# Patient Record
Sex: Male | Born: 1955 | Race: Black or African American | Hispanic: No | Smoking: Never smoker
Health system: Southern US, Community
[De-identification: ages and names within clinical notes are randomized; demographics above are authoritative.]

## PROBLEM LIST (undated history)

## (undated) DIAGNOSIS — I10 Essential (primary) hypertension: Secondary | ICD-10-CM

## (undated) DIAGNOSIS — M1990 Unspecified osteoarthritis, unspecified site: Secondary | ICD-10-CM

## (undated) DIAGNOSIS — K219 Gastro-esophageal reflux disease without esophagitis: Secondary | ICD-10-CM

## (undated) DIAGNOSIS — M109 Gout, unspecified: Secondary | ICD-10-CM

## (undated) HISTORY — PX: ANKLE FRACTURE SURGERY: SHX122

## (undated) HISTORY — PX: FOOT SURGERY: SHX648

---

## 1998-02-23 ENCOUNTER — Encounter: Payer: Self-pay | Admitting: Emergency Medicine

## 1998-02-23 ENCOUNTER — Inpatient Hospital Stay (HOSPITAL_COMMUNITY): Admission: EM | Admit: 1998-02-23 | Discharge: 1998-02-24 | Payer: Self-pay | Admitting: Emergency Medicine

## 1998-02-23 ENCOUNTER — Encounter: Payer: Self-pay | Admitting: Orthopedic Surgery

## 2004-08-05 ENCOUNTER — Emergency Department: Payer: Self-pay | Admitting: Emergency Medicine
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
## 2015-01-02 ENCOUNTER — Ambulatory Visit (INDEPENDENT_AMBULATORY_CARE_PROVIDER_SITE_OTHER): Payer: BLUE CROSS/BLUE SHIELD

## 2015-01-02 ENCOUNTER — Encounter: Payer: Self-pay | Admitting: Podiatry

## 2015-01-02 ENCOUNTER — Ambulatory Visit (INDEPENDENT_AMBULATORY_CARE_PROVIDER_SITE_OTHER): Payer: BLUE CROSS/BLUE SHIELD | Admitting: Podiatry

## 2015-01-02 VITALS — BP 148/98 | HR 72 | Resp 12

## 2015-01-02 DIAGNOSIS — M14679 Charcot's joint, unspecified ankle and foot: Secondary | ICD-10-CM

## 2015-01-02 DIAGNOSIS — M79673 Pain in unspecified foot: Secondary | ICD-10-CM | POA: Diagnosis not present

## 2015-01-02 DIAGNOSIS — R52 Pain, unspecified: Secondary | ICD-10-CM

## 2015-01-02 NOTE — Progress Notes (Signed)
   Subjective:    Patient ID: Bill Howell, male    DOB: 08/24/1955, 59 y.o.   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 He states that he has had foot issues over the past several years and has been seen by the [facility] and has had orthotics made as well as inserts. Currently he states that these orthotics are to harden there not helping him he has started to develop thickening of the medial aspect of the tarsometatarsal joints tickling the first metatarsal medial cuneiform joint consistent with Charcot deformity. He denies trauma. He states that he is on his feet all day and the pain is beginning to get to him.  Review of Systems  All other systems reviewed and are negative.      Objective:   Physical Exam: 6 foot 4 inches tall, 59 year old white male in no apparent distress vital signs stable alert and oriented 3. Pulses are strongly palpable. Neurologic sensorium is diminished per Semmes-Weinstein monofilament to the level of the ankles. Deep tendon reflexes are intact muscle strength is normal bilateral. No calf pain. Orthopedic evaluation demonstrates all joints is like a full range of motion without crepitus pes planus is noted bilateral. Very large thickened first metatarsocuneiform joint left greater than right consistent with Charcot deformity radiographs confirm pes planus with what appears to be Charcot deformity of the first metatarsal medial cuneiforms joint left greater than right. Cutaneous evaluation demonstrates supple well-hydrated. He is no skin breakdown.        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  Plan: He was scanned today for flexible set of orthotics we may need to consider medication next visit. Follow up with him when his orthotics from him.

## 2015-02-01 ENCOUNTER — Ambulatory Visit (INDEPENDENT_AMBULATORY_CARE_PROVIDER_SITE_OTHER): Payer: BLUE CROSS/BLUE SHIELD | Admitting: *Deleted

## 2015-02-01 DIAGNOSIS — M14679 Charcot's joint, unspecified ankle and foot: Secondary | ICD-10-CM

## 2015-03-15 ENCOUNTER — Encounter: Payer: Self-pay | Admitting: Podiatry

## 2015-03-15 ENCOUNTER — Ambulatory Visit (INDEPENDENT_AMBULATORY_CARE_PROVIDER_SITE_OTHER): Payer: BLUE CROSS/BLUE SHIELD | Admitting: Podiatry

## 2015-03-15 VITALS — BP 168/94 | HR 75 | Resp 18

## 2015-03-15 DIAGNOSIS — M14679 Charcot's joint, unspecified ankle and foot: Secondary | ICD-10-CM

## 2015-03-15 NOTE — Progress Notes (Signed)
He presents today for follow-up of his orthotics he has a history of Charcot joint. He states that he is doing just perfect. Lungs his orthotics.  Objective: Vital signs are stable he is alert and oriented 3 pulses are palpable. I evaluated his feet demonstrating no reactive hyperkeratoses no erythema edema cellulitis drainage or odor and no open wounds. I also evaluated the orthotics which appear to be doing very well.  Assessment: Charcot deformity.  Plan: Continue to use orthotics and notify us with questions or concerns. He was requesting a permanent handicap sticker. I explained to him that we do not provide permanent placards I suggested that he follow-up with his primary care provider.

## 2016-07-15 ENCOUNTER — Ambulatory Visit (INDEPENDENT_AMBULATORY_CARE_PROVIDER_SITE_OTHER): Payer: 59 | Admitting: Podiatry

## 2016-07-15 ENCOUNTER — Encounter: Payer: Self-pay | Admitting: Podiatry

## 2016-07-15 DIAGNOSIS — M14679 Charcot's joint, unspecified ankle and foot: Secondary | ICD-10-CM | POA: Diagnosis not present

## 2016-07-15 DIAGNOSIS — M79676 Pain in unspecified toe(s): Secondary | ICD-10-CM | POA: Diagnosis not present

## 2016-07-15 DIAGNOSIS — B351 Tinea unguium: Secondary | ICD-10-CM

## 2016-07-15 DIAGNOSIS — Q828 Other specified congenital malformations of skin: Secondary | ICD-10-CM

## 2016-07-15 NOTE — Progress Notes (Signed)
He presents today for follow-up of his painful calluses and elongated toenails. 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 He states that he has redeveloped a reactive hyperkeratotic sub-fourth met head right foot. For me to look at his toenails. I left my orthotics and might consider getting another pair made one that will accommodate for this right foot.  Objective: Vital signs are stable he is alert and oriented 3 Charcot deformity appears to be the same however he has a reactive hyperkeratotic lesion or poor keratotic lesion sub-fourth met head of the right foot without any signs of infection. His nails are long thick yellow dystrophic onychomycotic.  Assessment: Charcot deformity with neuropathy. Plantarflexed metatarsals with poor keratomas.  Plan: I debrided his nails and calluses today. He will be seen by Liliane Channel for orthotics will probably necessitate a new mole and offloading of his reactive hyperkeratosis. He will bring his old orthotics with him.

## 2016-08-05 ENCOUNTER — Ambulatory Visit: Payer: 59 | Admitting: Podiatry

## 2016-08-14 ENCOUNTER — Ambulatory Visit: Payer: 59

## 2017-02-18 DIAGNOSIS — R739 Hyperglycemia, unspecified: Secondary | ICD-10-CM | POA: Insufficient documentation

## 2017-02-18 DIAGNOSIS — K219 Gastro-esophageal reflux disease without esophagitis: Secondary | ICD-10-CM | POA: Insufficient documentation

## 2017-02-18 DIAGNOSIS — I10 Essential (primary) hypertension: Secondary | ICD-10-CM | POA: Insufficient documentation

## 2017-02-18 DIAGNOSIS — M1A00X0 Idiopathic chronic gout, unspecified site, without tophus (tophi): Secondary | ICD-10-CM | POA: Insufficient documentation

## 2019-03-12 ENCOUNTER — Ambulatory Visit: Payer: Medicare Other | Attending: Internal Medicine

## 2019-03-12 DIAGNOSIS — Z23 Encounter for immunization: Secondary | ICD-10-CM | POA: Insufficient documentation

## 2019-03-12 NOTE — Progress Notes (Signed)
   Covid-19 Vaccination Clinic  Name:  Bill Howell    MRN: [id number] DOB: 05-Sep-1955  03/12/2019  Bill Howell was observed post Covid-19 immunization for 15 minutes without incidence. He was provided with Vaccine Information Sheet and instruction to access the V-Safe system.   Bill Howell was instructed to call 911 with any severe reactions post vaccine: Marland Kitchen Difficulty breathing  . Swelling of your face and throat  . A fast heartbeat  . A bad rash all over your body  . Dizziness and weakness    Immunizations Administered    Name Date Dose VIS Date Route   Pfizer COVID-19 Vaccine 03/12/2019  2:06 PM 0.3 mL 01/08/2019 Intramuscular   Manufacturer: [place]   Lot: [id number]   [place]: [id number]

## 2019-04-04 ENCOUNTER — Ambulatory Visit: Payer: Medicare Other | Attending: Internal Medicine

## 2019-04-04 DIAGNOSIS — Z23 Encounter for immunization: Secondary | ICD-10-CM | POA: Insufficient documentation

## 2019-04-04 NOTE — Progress Notes (Signed)
   Covid-19 Vaccination Clinic  Name:  Bill Howell    MRN: [id number] DOB: 1955/04/01  04/04/2019  [name] was observed post Covid-19 immunization for 15 minutes without incident. He was provided with Vaccine Information Sheet and instruction to access the V-Safe system.   [name] was instructed to call 911 with any severe reactions post vaccine: Marland Kitchen Difficulty breathing  . Swelling of face and throat  . A fast heartbeat  . A bad rash all over body  . Dizziness and weakness   Immunizations Administered    Name Date Dose VIS Date Route   Pfizer COVID-19 Vaccine 04/04/2019 10:17 AM 0.3 mL 01/08/2019 Intramuscular   Manufacturer: [place]   Lot: [id number]   [place]: [id number]

## 2021-09-05 ENCOUNTER — Ambulatory Visit (INDEPENDENT_AMBULATORY_CARE_PROVIDER_SITE_OTHER): Payer: Medicare Other | Admitting: Podiatry

## 2021-09-05 ENCOUNTER — Ambulatory Visit (INDEPENDENT_AMBULATORY_CARE_PROVIDER_SITE_OTHER): Payer: Medicare Other

## 2021-09-05 ENCOUNTER — Encounter: Payer: Self-pay | Admitting: Podiatry

## 2021-09-05 DIAGNOSIS — Z135 Encounter for screening for eye and ear disorders: Secondary | ICD-10-CM | POA: Insufficient documentation

## 2021-09-05 DIAGNOSIS — L97511 Non-pressure chronic ulcer of other part of right foot limited to breakdown of skin: Secondary | ICD-10-CM | POA: Diagnosis not present

## 2021-09-05 DIAGNOSIS — D3500 Benign neoplasm of unspecified adrenal gland: Secondary | ICD-10-CM | POA: Insufficient documentation

## 2021-09-05 DIAGNOSIS — H6121 Impacted cerumen, right ear: Secondary | ICD-10-CM | POA: Insufficient documentation

## 2021-09-05 DIAGNOSIS — M109 Gout, unspecified: Secondary | ICD-10-CM | POA: Insufficient documentation

## 2021-09-05 DIAGNOSIS — G4730 Sleep apnea, unspecified: Secondary | ICD-10-CM | POA: Insufficient documentation

## 2021-09-05 DIAGNOSIS — M86171 Other acute osteomyelitis, right ankle and foot: Secondary | ICD-10-CM | POA: Diagnosis not present

## 2021-09-05 DIAGNOSIS — M79671 Pain in right foot: Secondary | ICD-10-CM | POA: Insufficient documentation

## 2021-09-05 DIAGNOSIS — E1140 Type 2 diabetes mellitus with diabetic neuropathy, unspecified: Secondary | ICD-10-CM | POA: Insufficient documentation

## 2021-09-05 DIAGNOSIS — M2140 Flat foot [pes planus] (acquired), unspecified foot: Secondary | ICD-10-CM | POA: Insufficient documentation

## 2021-09-05 DIAGNOSIS — M778 Other enthesopathies, not elsewhere classified: Secondary | ICD-10-CM | POA: Diagnosis not present

## 2021-09-05 DIAGNOSIS — Z1211 Encounter for screening for malignant neoplasm of colon: Secondary | ICD-10-CM | POA: Insufficient documentation

## 2021-09-05 DIAGNOSIS — R7989 Other specified abnormal findings of blood chemistry: Secondary | ICD-10-CM | POA: Insufficient documentation

## 2021-09-05 DIAGNOSIS — E119 Type 2 diabetes mellitus without complications: Secondary | ICD-10-CM | POA: Insufficient documentation

## 2021-09-05 DIAGNOSIS — R7309 Other abnormal glucose: Secondary | ICD-10-CM | POA: Insufficient documentation

## 2021-09-05 MED ORDER — AMOXICILLIN-POT CLAVULANATE 875-125 MG PO TABS: 1.0000 | ORAL_TABLET | Freq: Two times a day (BID) | ORAL | 0 refills | Status: DC

## 2021-09-05 MED ORDER — OXYCODONE-ACETAMINOPHEN 10-325 MG PO TABS: 1.0000 | ORAL_TABLET | Freq: Three times a day (TID) | ORAL | 0 refills | Status: DC | PRN

## 2021-09-05 MED ORDER — MUPIROCIN 2 % EX OINT: 1.0000 | TOPICAL_OINTMENT | Freq: Two times a day (BID) | CUTANEOUS | 0 refills | Status: AC

## 2021-09-05 NOTE — Progress Notes (Signed)
He presents today for a chief complaint of plantar forefoot pain to the his right foot.  States that there is a small wound there for the past several weeks the foot has been red on the top [place] is really cannot walk well states that nighttime seems to be the worst but he denies fever chills nausea vomiting muscle aches and pains other than the foot.  Objective: Vital signs are stable he is alert and oriented x 3 superficial wound to the plantar aspect of the third metatarsal head of the right foot does not demonstrate any purulence or malodor wound measures only about 3 mm in diameter.  The majority of the pain is on the dorsal surface at the level of the metatarsal phalangeal joint area.  There is mild erythema some mild edema pain on range of motion of the toe.  Radiographs taken today demonstrate an osseously mature foot the area in question is the third metatarsal which does demonstrate some breakdown of the head of the metatarsal either due to complete dislocation of the toe and rubbing or septic joint or osteomyelitis.  Assessment: Ulceration plantar aspect forefoot right that part appears to be clean.  Cannot rule out a septic joint or osteomyelitis.  Plan: With no constitutional signs of infection at this point we will request the MRI of the forefoot concerning for osteomyelitis or septic joint.  Possible tear of the plantar ligament.  Will go ahead and start him on antibiotic ointment Bactroban dressing daily try to stay off the foot is much as possible.  Also requesting Augmentin 875 1 p.o. twice daily and I prescribed him oxycodone with acetaminophen.  Follow-up with him as soon as the MRI comes back.

## 2021-09-17 ENCOUNTER — Telehealth: Payer: Self-pay

## 2021-09-17 ENCOUNTER — Other Ambulatory Visit: Payer: Self-pay | Admitting: Podiatry

## 2021-09-17 MED ORDER — OXYCODONE-ACETAMINOPHEN 10-325 MG PO TABS: 1.0000 | ORAL_TABLET | Freq: Three times a day (TID) | ORAL | 0 refills | Status: AC | PRN

## 2021-09-17 NOTE — Telephone Encounter (Signed)
Done

## 2021-09-19 ENCOUNTER — Ambulatory Visit
Admission: RE | Admit: 2021-09-19 | Discharge: 2021-09-19 | Disposition: A | Discharge status: Home / Self Care | Payer: Medicare Other | Source: Ambulatory Visit | Attending: Podiatry | Admitting: Podiatry

## 2021-09-19 DIAGNOSIS — L97511 Non-pressure chronic ulcer of other part of right foot limited to breakdown of skin: Secondary | ICD-10-CM

## 2021-09-19 DIAGNOSIS — M86171 Other acute osteomyelitis, right ankle and foot: Secondary | ICD-10-CM

## 2021-09-25 ENCOUNTER — Telehealth: Payer: Self-pay | Admitting: *Deleted

## 2021-09-25 NOTE — Telephone Encounter (Signed)
Patient has been set up with Dr. Posey Pronto for consult.

## 2021-09-25 NOTE — Telephone Encounter (Signed)
"  I need to know what Dr. Milinda Pointer would like to do with me.  Does he need to schedule another appointment with me.  Could someone give me a call and let me know what's going on?"

## 2021-09-27 ENCOUNTER — Other Ambulatory Visit: Payer: Self-pay | Admitting: Podiatry

## 2021-09-27 ENCOUNTER — Telehealth: Payer: Self-pay | Admitting: *Deleted

## 2021-09-27 MED ORDER — OXYCODONE-ACETAMINOPHEN 10-325 MG PO TABS: 1.0000 | ORAL_TABLET | Freq: Three times a day (TID) | ORAL | 0 refills | Status: AC | PRN

## 2021-09-27 NOTE — Telephone Encounter (Signed)
Patient is requesting a refill of oxycodone-ace,10-325 mg,only 2 remaining,please advise.

## 2021-09-27 NOTE — Progress Notes (Unsigned)
per

## 2021-10-02 ENCOUNTER — Telehealth: Payer: Self-pay | Admitting: *Deleted

## 2021-10-02 ENCOUNTER — Ambulatory Visit (INDEPENDENT_AMBULATORY_CARE_PROVIDER_SITE_OTHER): Payer: Medicare Other | Admitting: Podiatry

## 2021-10-02 DIAGNOSIS — Z01818 Encounter for other preprocedural examination: Secondary | ICD-10-CM | POA: Diagnosis not present

## 2021-10-02 DIAGNOSIS — L97511 Non-pressure chronic ulcer of other part of right foot limited to breakdown of skin: Secondary | ICD-10-CM | POA: Diagnosis not present

## 2021-10-02 DIAGNOSIS — M86171 Other acute osteomyelitis, right ankle and foot: Secondary | ICD-10-CM | POA: Diagnosis not present

## 2021-10-02 MED ORDER — DOXYCYCLINE HYCLATE 100 MG PO TABS: 100.0000 mg | ORAL_TABLET | Freq: Two times a day (BID) | ORAL | 0 refills | Status: AC

## 2021-10-02 MED ORDER — OXYCODONE-ACETAMINOPHEN 5-325 MG PO TABS: 1.0000 | ORAL_TABLET | ORAL | 0 refills | Status: DC | PRN

## 2021-10-02 NOTE — Telephone Encounter (Signed)
Patient called for status of medications being sent to pharmacy,explained that they were sent to [facility] on file.

## 2021-10-09 NOTE — Progress Notes (Signed)
Subjective:  Patient ID: Bill Howell, male    DOB: 01/29/1955,  MRN: [id number]  Chief Complaint  Patient presents with   Foot Pain    Rt foot     66 y.o. male presents with the above complaint.  Patient presents with follow-up from right severely plantarflexed third metatarsal.  Patient's has a wound that is been developing that probes down to deep bone.  He was being started conservative care by Dr. Milinda Pointer.  He had an MRI done and is referred to me for surgical consultation.  He denies any other acute complaints he denies any nausea fever chills vomiting.  He has not been taking any antibiotics.  He here to go over the MRI.  Review of Systems: Negative except as noted in the HPI. Denies N/V/F/Ch.  No past medical history on file.  Current Outpatient Medications:    doxycycline (VIBRA-TABS) 100 MG tablet, Take 1 tablet (100 mg total) by mouth 2 (two) times daily., Disp: 60 tablet, Rfl: 0   oxyCODONE-acetaminophen (PERCOCET) 5-325 MG tablet, Take 1 tablet by mouth every 4 (four) hours as needed for severe pain., Disp: 30 tablet, Rfl: 0   allopurinol (ZYLOPRIM) 100 MG tablet, , Disp: , Rfl:    amoxicillin-clavulanate (AUGMENTIN) 875-125 MG tablet, Take 1 tablet by mouth 2 (two) times daily., Disp: 20 tablet, Rfl: 0   capsaicin (ZOSTRIX) 0.025 % cream, , Disp: , Rfl:    carvedilol (COREG) 12.5 MG tablet, , Disp: , Rfl:    losartan (COZAAR) 100 MG tablet, Take 100 mg by mouth daily., Disp: , Rfl:    metFORMIN (GLUCOPHAGE) 500 MG tablet, Take by mouth 2 (two) times daily with a meal., Disp: , Rfl:    mupirocin ointment (BACTROBAN) 2 %, Apply 1 Application topically 2 (two) times daily., Disp: 22 g, Rfl: 0   omeprazole (PRILOSEC) 20 MG capsule, , Disp: , Rfl:    valsartan (DIOVAN) 160 MG tablet, TAKE 1 TABLET BY MOUTH EVERY DAY IN THE MORNING, Disp: , Rfl: 0  Social History   Tobacco Use  Smoking Status Never  Smokeless Tobacco Never    Allergies  Allergen Reactions   Lisinopril      Other reaction(s): Xerostomia   Gabapentin Anxiety   Objective:  There were no vitals filed for this visit. There is no height or weight on file to calculate BMI. Constitutional Well developed. Well nourished.  Vascular Dorsalis pedis pulses palpable bilaterally. Posterior tibial pulses palpable bilaterally. Capillary refill normal to all digits.  No cyanosis or clubbing noted. Pedal hair growth normal.  Neurologic Normal speech. Oriented to person, place, and time. Epicritic sensation to light touch grossly present bilaterally.  Dermatologic Open wound noted to submetatarsal 3 probing down to deep tissue no purulent drainage noted.  No redness noted.  No signs of infection noted.  Orthopedic: Right severely plantarflexed third metatarsal noted.   Radiographs: IMPRESSION: 1. Although no gross radiographic change is identified, the distal 3rd metatarsal and base of the 3rd proximal phalanx are grossly abnormal on MRI. There is dorsal dislocation with bone destruction, T2 marrow hyperintensity and apparent plantar soft tissue ulceration, highly suspicious for osteomyelitis. 2. Associated probable septic arthritis and small surrounding soft tissue abscesses. 3. 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 Ulcer of right foot, limited to breakdown of skin ([place])   2. Acute osteomyelitis of right ankle or foot ([place])   3. Encounter for preoperative examination for general surgical procedure    Plan:  Patient  was evaluated and treated and all questions answered.  Right third metatarsal head osteomyelitis and base of proximal phalanx osteomyelitis -All questions and concerns were discussed with the patient in extensive detail -I am I was reviewed with the patient in extensive detail, given that there is suspicion for osteomyelitis I believe patient will benefit from metatarsectomy of the third metatarsal head and base of the proximal phalanx.  Ultimately I  discussed with him that there may be transfer lesion in the future that we may need to address he states understanding.  He has a wound that correlates clinically down to the bone therefore I am suspicious for osteomyelitis as well clinically.  I discussed with him that he will benefit from internal amputation.  He states understanding would like to proceed with surgery. -I discussed my preoperative intraoperative postop plan in extensive detail. -Informed surgical risk consent was reviewed and read aloud to the patient.  I reviewed the films.  I have discussed my findings with the patient in great detail.  I have discussed all risks including but not limited to infection, stiffness, scarring, limp, disability, deformity, damage to blood vessels and nerves, numbness, poor healing, need for braces, arthritis, chronic pain, amputation, death.  All benefits and realistic expectations discussed in great detail.  I have made no promises as to the outcome.  I have provided realistic expectations.  I have offered the patient a 2nd opinion, which they have declined and assured me they preferred to proceed despite the risks   No follow-ups on file.

## 2021-10-15 ENCOUNTER — Telehealth: Payer: Self-pay | Admitting: *Deleted

## 2021-10-15 MED ORDER — OXYCODONE-ACETAMINOPHEN 5-325 MG PO TABS: 1.0000 | ORAL_TABLET | ORAL | 0 refills | Status: DC | PRN

## 2021-10-15 NOTE — Telephone Encounter (Signed)
Patient is requesting a pain medication refill. (Oxycodone-ace,5-325 mg) sent to [facility].

## 2021-10-24 ENCOUNTER — Other Ambulatory Visit: Payer: Self-pay | Admitting: Podiatry

## 2021-10-25 NOTE — Telephone Encounter (Signed)
Patient is calling for another refill of the pain medicine, oxycodone-ace, 5/325 mg,only one pill remaining and is scheduled for upcoming surgery. Please advise.

## 2021-10-29 ENCOUNTER — Encounter: Payer: Self-pay | Admitting: Podiatry

## 2021-10-29 ENCOUNTER — Other Ambulatory Visit: Payer: Self-pay | Admitting: Podiatry

## 2021-10-29 DIAGNOSIS — M21541 Acquired clubfoot, right foot: Secondary | ICD-10-CM

## 2021-10-29 MED ORDER — IBUPROFEN 800 MG PO TABS: 800.0000 mg | ORAL_TABLET | Freq: Four times a day (QID) | ORAL | 1 refills | Status: DC | PRN

## 2021-10-29 MED ORDER — OXYCODONE-ACETAMINOPHEN 5-325 MG PO TABS: 1.0000 | ORAL_TABLET | ORAL | 0 refills | Status: DC | PRN

## 2021-10-30 ENCOUNTER — Other Ambulatory Visit: Payer: Medicare Other

## 2021-10-31 NOTE — Telephone Encounter (Signed)
Patient is calling for status of pain medicine that was supposed to be sent to pharmacy, script failed,did not go to pharmacy, please resend

## 2021-11-06 ENCOUNTER — Encounter: Payer: Self-pay | Admitting: Podiatry

## 2021-11-06 ENCOUNTER — Ambulatory Visit (INDEPENDENT_AMBULATORY_CARE_PROVIDER_SITE_OTHER): Payer: Medicare Other

## 2021-11-06 ENCOUNTER — Ambulatory Visit (INDEPENDENT_AMBULATORY_CARE_PROVIDER_SITE_OTHER): Payer: Medicare Other | Admitting: Podiatry

## 2021-11-06 DIAGNOSIS — Z9889 Other specified postprocedural states: Secondary | ICD-10-CM | POA: Diagnosis not present

## 2021-11-06 DIAGNOSIS — M216X1 Other acquired deformities of right foot: Secondary | ICD-10-CM

## 2021-11-06 MED ORDER — OXYCODONE-ACETAMINOPHEN 5-325 MG PO TABS: 1.0000 | ORAL_TABLET | ORAL | 0 refills | Status: DC | PRN

## 2021-11-06 NOTE — Progress Notes (Signed)
Subjective:  Patient ID: Bill Howell, male    DOB: [birth year],  MRN: [id number]  Chief Complaint  Patient presents with   Routine Post Op    "I don't need xrays because all he did was open my foot up and then stitched it back.  I haven't talked to anyone since the surgery about why."    DOS: 10/29/2021 Procedure: Right third metatarsal floating osteotomy  66 y.o. male returns for post-op check.  Patient states that he is doing okay.  Minimal pain.  He would like his pain medication.  He has been ambulating with his regular shoes I encouraged him to put surgical shoe back on.  He states understanding  Review of Systems: Negative except as noted in the HPI. Denies N/V/F/Ch.  No past medical history on file.  Current Outpatient Medications:    oxyCODONE-acetaminophen (PERCOCET) 5-325 MG tablet, Take 1 tablet by mouth every 4 (four) hours as needed for severe pain., Disp: 30 tablet, Rfl: 0   allopurinol (ZYLOPRIM) 100 MG tablet, , Disp: , Rfl:    amoxicillin-clavulanate (AUGMENTIN) 875-125 MG tablet, Take 1 tablet by mouth 2 (two) times daily., Disp: 20 tablet, Rfl: 0   capsaicin (ZOSTRIX) 0.025 % cream, , Disp: , Rfl:    carvedilol (COREG) 12.5 MG tablet, , Disp: , Rfl:    ibuprofen (ADVIL) 800 MG tablet, Take 1 tablet (800 mg total) by mouth every 6 (six) hours as needed., Disp: 60 tablet, Rfl: 1   losartan (COZAAR) 100 MG tablet, Take 100 mg by mouth daily., Disp: , Rfl:    metFORMIN (GLUCOPHAGE) 500 MG tablet, Take by mouth 2 (two) times daily with a meal., Disp: , Rfl:    mupirocin ointment (BACTROBAN) 2 %, Apply 1 Application topically 2 (two) times daily., Disp: 22 g, Rfl: 0   omeprazole (PRILOSEC) 20 MG capsule, , Disp: , Rfl:    oxyCODONE-acetaminophen (PERCOCET) 5-325 MG tablet, Take 1 tablet by mouth every 4 (four) hours as needed for severe pain. (Patient not taking: Reported on 11/06/2021), Disp: 30 tablet, Rfl: 0   oxyCODONE-acetaminophen (PERCOCET) 5-325 MG tablet, Take 1  tablet by mouth every 4 (four) hours as needed for severe pain., Disp: 30 tablet, Rfl: 0   valsartan (DIOVAN) 160 MG tablet, TAKE 1 TABLET BY MOUTH EVERY DAY IN THE MORNING, Disp: , Rfl: 0  Social History   Tobacco Use  Smoking Status Never  Smokeless Tobacco Never    Allergies  Allergen Reactions   Lisinopril     Other reaction(s): Xerostomia   Gabapentin Anxiety   Objective:  There were no vitals filed for this visit. There is no height or weight on file to calculate BMI. Constitutional Well developed. Well nourished.  Vascular Foot warm and well perfused. Capillary refill normal to all digits.   Neurologic Normal speech. Oriented to person, place, and time. Epicritic sensation to light touch grossly present bilaterally.  Dermatologic Skin healing well without signs of infection. Skin edges well coapted without signs of infection.  Orthopedic: Tenderness to palpation noted about the surgical site.   Radiographs: None.  Patient refused Assessment:   1. Status post surgery   2. Plantar flexed metatarsal, right    Plan:  Patient was evaluated and treated and all questions answered.  S/p foot surgery right -Progressing as expected post-operatively. -XR: See above -WB Status: While bearing as tolerated in surgical shoe -Sutures: Intact.  No clinical signs of Deis is noted no complication noted. -Medications: Percocet -Foot redressed.  No  follow-ups on file.

## 2021-11-20 ENCOUNTER — Ambulatory Visit (INDEPENDENT_AMBULATORY_CARE_PROVIDER_SITE_OTHER): Payer: Medicare Other | Admitting: Podiatry

## 2021-11-20 DIAGNOSIS — Z9889 Other specified postprocedural states: Secondary | ICD-10-CM

## 2021-11-20 DIAGNOSIS — M216X1 Other acquired deformities of right foot: Secondary | ICD-10-CM

## 2021-11-20 NOTE — Progress Notes (Signed)
  Subjective:  Patient ID: Bill Howell, male    DOB: 1955/10/18,  MRN: [id number]  Chief Complaint  Patient presents with   Routine Post Op    DOS: 10/29/2021 Procedure: Right third metatarsal floating osteotomy  66 y.o. male returns for post-op check.  He states feeling a lot better occasional discomfort but overall much better he is transitioning regular shoes.  Review of Systems: Negative except as noted in the HPI. Denies N/V/F/Ch.  No past medical history on file.  Current Outpatient Medications:    allopurinol (ZYLOPRIM) 100 MG tablet, , Disp: , Rfl:    amoxicillin-clavulanate (AUGMENTIN) 875-125 MG tablet, Take 1 tablet by mouth 2 (two) times daily., Disp: 20 tablet, Rfl: 0   capsaicin (ZOSTRIX) 0.025 % cream, , Disp: , Rfl:    carvedilol (COREG) 12.5 MG tablet, , Disp: , Rfl:    ibuprofen (ADVIL) 800 MG tablet, Take 1 tablet (800 mg total) by mouth every 6 (six) hours as needed., Disp: 60 tablet, Rfl: 1   losartan (COZAAR) 100 MG tablet, Take 100 mg by mouth daily., Disp: , Rfl:    metFORMIN (GLUCOPHAGE) 500 MG tablet, Take by mouth 2 (two) times daily with a meal., Disp: , Rfl:    mupirocin ointment (BACTROBAN) 2 %, Apply 1 Application topically 2 (two) times daily., Disp: 22 g, Rfl: 0   omeprazole (PRILOSEC) 20 MG capsule, , Disp: , Rfl:    oxyCODONE-acetaminophen (PERCOCET) 5-325 MG tablet, Take 1 tablet by mouth every 4 (four) hours as needed for severe pain. (Patient not taking: Reported on 11/06/2021), Disp: 30 tablet, Rfl: 0   oxyCODONE-acetaminophen (PERCOCET) 5-325 MG tablet, Take 1 tablet by mouth every 4 (four) hours as needed for severe pain., Disp: 30 tablet, Rfl: 0   oxyCODONE-acetaminophen (PERCOCET) 5-325 MG tablet, Take 1 tablet by mouth every 4 (four) hours as needed for severe pain., Disp: 30 tablet, Rfl: 0   valsartan (DIOVAN) 160 MG tablet, TAKE 1 TABLET BY MOUTH EVERY DAY IN THE MORNING, Disp: , Rfl: 0  Social History   Tobacco Use  Smoking Status  Never  Smokeless Tobacco Never    Allergies  Allergen Reactions   Lisinopril     Other reaction(s): Xerostomia   Gabapentin Anxiety   Objective:  There were no vitals filed for this visit. There is no height or weight on file to calculate BMI. Constitutional Well developed. Well nourished.  Vascular Foot warm and well perfused. Capillary refill normal to all digits.   Neurologic Normal speech. Oriented to person, place, and time. Epicritic sensation to light touch grossly present bilaterally.  Dermatologic Skin completely reepithelialized.  No signs of dehiscence noted no complication noted.  Orthopedic: No further tenderness to palpation noted about the surgical site.   Radiographs: None.  Patient refused Assessment:   No diagnosis found.  Plan:  Patient was evaluated and treated and all questions answered.  S/p foot surgery right -Clinically healed and officially discharged from my care.  Reduction of pressure noted to the submetatarsal 3.  No other acute complaints. -I discussed with him prevention technique and if any foot and ankle issues arises in the future asked her to come back and see me.  He states understanding  No follow-ups on file.

## 2022-04-26 ENCOUNTER — Other Ambulatory Visit: Payer: Self-pay | Admitting: Internal Medicine

## 2022-04-26 DIAGNOSIS — R1011 Right upper quadrant pain: Secondary | ICD-10-CM

## 2022-05-13 ENCOUNTER — Ambulatory Visit
Admission: RE | Admit: 2022-05-13 | Discharge: 2022-05-13 | Disposition: A | Discharge status: Home / Self Care | Payer: No Typology Code available for payment source | Source: Ambulatory Visit | Attending: Internal Medicine | Admitting: Internal Medicine

## 2022-05-13 DIAGNOSIS — R1011 Right upper quadrant pain: Secondary | ICD-10-CM

## 2022-08-19 ENCOUNTER — Other Ambulatory Visit: Payer: Self-pay | Admitting: Internal Medicine

## 2022-08-19 DIAGNOSIS — R9389 Abnormal findings on diagnostic imaging of other specified body structures: Secondary | ICD-10-CM

## 2022-08-19 DIAGNOSIS — R0789 Other chest pain: Secondary | ICD-10-CM

## 2022-08-23 ENCOUNTER — Ambulatory Visit
Admission: RE | Admit: 2022-08-23 | Discharge: 2022-08-23 | Disposition: A | Discharge status: Home / Self Care | Payer: No Typology Code available for payment source | Source: Ambulatory Visit | Attending: Internal Medicine | Admitting: Internal Medicine

## 2022-08-23 DIAGNOSIS — R9389 Abnormal findings on diagnostic imaging of other specified body structures: Secondary | ICD-10-CM | POA: Diagnosis present

## 2022-08-23 DIAGNOSIS — R0789 Other chest pain: Secondary | ICD-10-CM | POA: Diagnosis present

## 2022-10-16 ENCOUNTER — Ambulatory Visit (INDEPENDENT_AMBULATORY_CARE_PROVIDER_SITE_OTHER)
Payer: No Typology Code available for payment source | Admitting: Student in an Organized Health Care Education/Training Program

## 2022-10-16 ENCOUNTER — Encounter: Payer: Self-pay | Admitting: Student in an Organized Health Care Education/Training Program

## 2022-10-16 VITALS — BP 136/86 | HR 81 | Temp 98.0°F | Ht 76.0 in | Wt 308.0 lb

## 2022-10-16 DIAGNOSIS — R911 Solitary pulmonary nodule: Secondary | ICD-10-CM | POA: Diagnosis not present

## 2022-10-16 NOTE — Progress Notes (Signed)
Synopsis: Referred in for pulmonary nodularity by [name], MD  Assessment & Plan:   1. Lung nodule  The patient is presenting for the workup of very fine centrilobular nodularity noted on his recent chest CT.  He is asymptomatic and given that he has a never smoker, this is unlikely to represent respiratory bronchiolitis secondary to cigarette smoke.  While I was able to review his PFT report, I asked that he bring in a scanned copy of the PFT so we can review the flow-volume loops.  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  I would not recommend any further workup at this point but will recommend a repeat CT scan in 1 year.  I have asked the patient to come back in 12 months at which point we will order the repeat CT and see him again for follow-up.  [facility]  - Repeat chest CT in 1 year  Return in about 1 year (around 10/16/2023).  I spent 60 minutes caring for this patient today, including preparing to see the patient, obtaining a medical history , reviewing a separately obtained history, performing a medically appropriate examination and/or evaluation, counseling and educating the patient/family/caregiver, and documenting clinical information in the electronic health record  [name], MD [facility] 10/16/2022 9:44 AM    End of visit medications:  No orders of the defined types were placed in this encounter.    Current Outpatient Medications:    allopurinol (ZYLOPRIM) 100 MG tablet, , Disp: , Rfl:    amoxicillin-clavulanate (AUGMENTIN) 875-125 MG tablet, Take 1 tablet by mouth 2 (two) times daily., Disp: 20 tablet, Rfl: 0   capsaicin (ZOSTRIX) 0.025 % cream, , Disp: , Rfl:    carvedilol (COREG) 25 MG tablet, Take 25 mg by mouth 2 (two) times daily with a meal., Disp: ,  Rfl:    ibuprofen (ADVIL) 800 MG tablet, Take 1 tablet (800 mg total) by mouth every 6 (six) hours as needed., Disp: 60 tablet, Rfl: 1   losartan (COZAAR) 100 MG tablet, Take 100 mg by mouth daily., Disp: , Rfl:    metFORMIN (GLUCOPHAGE) 1000 MG tablet, Take 1,000 mg by mouth 2 (two) times daily with a meal., Disp: , Rfl:    mupirocin ointment (BACTROBAN) 2 %, Apply 1 Application topically 2 (two) times daily., Disp: 22 g, Rfl: 0   omeprazole (PRILOSEC) 20 MG capsule, , Disp: , Rfl:    oxyCODONE-acetaminophen (PERCOCET) 5-325 MG tablet, Take 1 tablet by mouth every 4 (four) hours as needed for severe pain., Disp: 30 tablet, Rfl: 0   oxyCODONE-acetaminophen (PERCOCET) 5-325 MG tablet, Take 1 tablet by mouth every 4 (four) hours as needed for severe pain., Disp: 30 tablet, Rfl: 0   oxyCODONE-acetaminophen (PERCOCET) 5-325 MG tablet, Take 1 tablet by mouth every 4 (four) hours as needed for severe pain., Disp: 30 tablet, Rfl: 0   Semaglutide,0.25 or 0.5MG /DOS, 2 MG/3ML SOPN, Inject into the skin., Disp: , Rfl:    valsartan (DIOVAN) 160 MG tablet, TAKE 1 TABLET BY MOUTH EVERY DAY IN THE MORNING, Disp: , Rfl: 0   Subjective:   PATIENT ID: Bill Howell GENDER: male DOB: 1955/08/08, MRN: [id number]  Chief Complaint  Patient presents with   pulmonary consult    No cough, shortness of breath or  wheezing. Occasional shortness of breath with exertion.     HPI  The patient is a pleasant 67 year old male presenting to clinic for the evaluation of findings on his high-resolution chest CT.  Patient reports being in his usual state of health and denies any respiratory symptoms.  He has no shortness of breath, no cough, no chest pain, no tightness, no sputum production, no fevers, and no chills.  Patient has been seen at the [facility] clinic where he underwent pulmonary function testing.  PFTs were suggestive of restriction and he underwent a high-resolution chest CT for confirmation.  I am able to review the  report from the PFTs but not the flow-volume loops.  This was performed in August 2024 with the report showing no spirometry evidence of obstruction, restriction present with a TLC of less than 80% predicted, and mild gas transfer defect that was borderline normal.  Patient then underwent a chest CT without contrast that showed minimal paraseptal emphysema with mild diffuse bronchial wall thickening and defined centrilobular nodularity throughout the lungs, most concentrated in the lung apices.  The read reports that this is consistent with smoking-related respiratory bronchiolitis.  Patient reports that he is a never smoker but did have some secondhand smoke exposure in the past.  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  He did not deploy anywhere with an active war zone nor did he get exposed to burn pits.  He did spend time at camp [place].  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  Other medical problems include HTN, HLD, and hepatic steatosis with concern for cirrhosis for which he's been following with gastroenterology.  Ancillary information including prior medications, full medical/surgical/family/social histories, and PFTs (when available) are listed below and have been reviewed.   Review of Systems  Constitutional:  Negative for chills, fever and weight loss.  Respiratory:  Negative for cough, hemoptysis, sputum production, shortness of breath and wheezing.   Cardiovascular:  Negative for chest pain and palpitations.     Objective:   Vitals:   10/16/22 0909  BP: 136/86  Pulse: 81  Temp: 98 F (36.7 C)  TempSrc: Temporal  SpO2: 97%  Weight: (!) 308 lb (139.7 kg)  Height: 6\' 4"  (1.93 m)   97% on RA BMI Readings from Last 3 Encounters:  10/16/22 37.49 kg/m   Wt Readings from Last 3 Encounters:  10/16/22 (!) 308 lb (139.7 kg)    Physical Exam Constitutional:       Appearance: Normal appearance. He is obese.  Cardiovascular:     Rate and Rhythm: Normal rate and regular rhythm.     Pulses: Normal pulses.     Heart sounds: Normal heart sounds.  Pulmonary:     Effort: Pulmonary effort is normal. No respiratory distress.     Breath sounds: Normal breath sounds. No wheezing or rales.  Neurological:     General: No focal deficit present.     Mental Status: He is alert and oriented to person, place, and time. Mental status is at baseline.       Ancillary Information    No past medical history on file.   No family history on file.   No past surgical history on file.  Social History   Socioeconomic History   Marital status: Married    Spouse name: Not on file   Number of children: Not  on file   Years of education: Not on file   Highest education level: Not on file  Occupational History   Not on file  Tobacco Use   Smoking status: Never   Smokeless tobacco: Never  Substance and Sexual Activity   Alcohol use: No    Alcohol/week: 0.0 standard drinks of alcohol   Drug use: No   Sexual activity: Not on file  Other Topics Concern   Not on file  Social History Narrative   Not on file   Social Determinants of Health   Financial Resource Strain: Not on file  Food Insecurity: Not on file  Transportation Needs: Not on file  Physical Activity: Not on file  Stress: Not on file  Social Connections: Not on file  Intimate Partner Violence: Not on file     Allergies  Allergen Reactions   Lisinopril     Other reaction(s): Xerostomia   Gabapentin Anxiety     CBC No results found for: "WBC", "RBC", "HGB", "HCT", "PLT", "MCV", "MCH", "MCHC", "RDW", "LYMPHSABS", "MONOABS", "EOSABS", "BASOSABS"  Pulmonary Functions Testing Results:     No data to display          Outpatient Medications Prior to Visit  Medication Sig Dispense Refill   allopurinol (ZYLOPRIM) 100 MG tablet      amoxicillin-clavulanate (AUGMENTIN) 875-125 MG tablet  Take 1 tablet by mouth 2 (two) times daily. 20 tablet 0   capsaicin (ZOSTRIX) 0.025 % cream      carvedilol (COREG) 25 MG tablet Take 25 mg by mouth 2 (two) times daily with a meal.     ibuprofen (ADVIL) 800 MG tablet Take 1 tablet (800 mg total) by mouth every 6 (six) hours as needed. 60 tablet 1   losartan (COZAAR) 100 MG tablet Take 100 mg by mouth daily.     metFORMIN (GLUCOPHAGE) 1000 MG tablet Take 1,000 mg by mouth 2 (two) times daily with a meal.     mupirocin ointment (BACTROBAN) 2 % Apply 1 Application topically 2 (two) times daily. 22 g 0   omeprazole (PRILOSEC) 20 MG capsule      oxyCODONE-acetaminophen (PERCOCET) 5-325 MG tablet Take 1 tablet by mouth every 4 (four) hours as needed for severe pain. 30 tablet 0   oxyCODONE-acetaminophen (PERCOCET) 5-325 MG tablet Take 1 tablet by mouth every 4 (four) hours as needed for severe pain. 30 tablet 0   oxyCODONE-acetaminophen (PERCOCET) 5-325 MG tablet Take 1 tablet by mouth every 4 (four) hours as needed for severe pain. 30 tablet 0   Semaglutide,0.25 or 0.5MG /DOS, 2 MG/3ML SOPN Inject into the skin.     valsartan (DIOVAN) 160 MG tablet TAKE 1 TABLET BY MOUTH EVERY DAY IN THE MORNING  0   carvedilol (COREG) 12.5 MG tablet      metFORMIN (GLUCOPHAGE) 500 MG tablet Take by mouth 2 (two) times daily with a meal.     No facility-administered medications prior to visit.

## 2022-11-29 ENCOUNTER — Telehealth: Payer: Self-pay

## 2022-11-29 NOTE — Telephone Encounter (Signed)
We received the referral again to schedule the patient colonoscopy. It does not look like patient has been call yet to schedule.

## 2022-11-29 NOTE — Telephone Encounter (Signed)
Pt requested call back in December to schedule colonoscopy early January 2025.  Reminder created to call patient back to schedule.  Thanks,  [place], [place]

## 2022-12-16 ENCOUNTER — Telehealth: Payer: Self-pay

## 2022-12-16 NOTE — Telephone Encounter (Signed)
Patient called in to schedule his colonoscopy.

## 2022-12-17 ENCOUNTER — Telehealth: Payer: Self-pay

## 2022-12-17 ENCOUNTER — Other Ambulatory Visit: Payer: Self-pay

## 2022-12-17 DIAGNOSIS — H9202 Otalgia, left ear: Secondary | ICD-10-CM | POA: Insufficient documentation

## 2022-12-17 DIAGNOSIS — Z1211 Encounter for screening for malignant neoplasm of colon: Secondary | ICD-10-CM

## 2022-12-17 DIAGNOSIS — Q6650 Congenital pes planus, unspecified foot: Secondary | ICD-10-CM | POA: Insufficient documentation

## 2022-12-17 DIAGNOSIS — L02619 Cutaneous abscess of unspecified foot: Secondary | ICD-10-CM | POA: Insufficient documentation

## 2022-12-17 DIAGNOSIS — L97409 Non-pressure chronic ulcer of unspecified heel and midfoot with unspecified severity: Secondary | ICD-10-CM | POA: Insufficient documentation

## 2022-12-17 DIAGNOSIS — M2010 Hallux valgus (acquired), unspecified foot: Secondary | ICD-10-CM | POA: Insufficient documentation

## 2022-12-17 DIAGNOSIS — R0602 Shortness of breath: Secondary | ICD-10-CM | POA: Insufficient documentation

## 2022-12-17 DIAGNOSIS — R972 Elevated prostate specific antigen [PSA]: Secondary | ICD-10-CM | POA: Insufficient documentation

## 2022-12-17 DIAGNOSIS — K7581 Nonalcoholic steatohepatitis (NASH): Secondary | ICD-10-CM | POA: Insufficient documentation

## 2022-12-17 MED ORDER — GOLYTELY 236 G PO SOLR: 4000.0000 mL | Freq: Once | ORAL | 0 refills | Status: AC

## 2022-12-17 MED ORDER — GOLYTELY 236 G PO SOLR: 4000.0000 mL | Freq: Once | ORAL | 0 refills | Status: DC

## 2022-12-17 NOTE — Telephone Encounter (Signed)
Patient requested prep to be sent to the [facility] instead of [facility] due to cost.  Thanks, [place], [place]

## 2022-12-17 NOTE — Telephone Encounter (Signed)
Gastroenterology Pre-Procedure Review  Request Date: 02/10/23 Requesting Physician: Dr. Tobi Bastos  PATIENT REVIEW QUESTIONS: The patient responded to the following health history questions as indicated:    1. Are you having any GI issues? no 2. Do you have a personal history of Polyps? no 3. Do you have a family history of Colon Cancer or Polyps? no 4. Diabetes Mellitus? yes (takes ozempic, metformin, and sitogliptin patient verbally adivsed of stop dates and noted on instructions) 5. Joint replacements in the past 12 months?no 6. Major health problems in the past 3 months?no 7. Any artificial heart valves, MVP, or defibrillator?no    MEDICATIONS & ALLERGIES:    Patient reports the following regarding taking any anticoagulation/antiplatelet therapy:   Plavix, Coumadin, Eliquis, Xarelto, Lovenox, Pradaxa, Brilinta, or Effient? no Aspirin? no  Patient confirms/reports the following medications:  Current Outpatient Medications  Medication Sig Dispense Refill   allopurinol (ZYLOPRIM) 100 MG tablet      amoxicillin-clavulanate (AUGMENTIN) 875-125 MG tablet Take 1 tablet by mouth 2 (two) times daily. 20 tablet 0   capsaicin (ZOSTRIX) 0.025 % cream      carvedilol (COREG) 25 MG tablet Take 25 mg by mouth 2 (two) times daily with a meal.     ibuprofen (ADVIL) 800 MG tablet Take 1 tablet (800 mg total) by mouth every 6 (six) hours as needed. 60 tablet 1   losartan (COZAAR) 100 MG tablet Take 100 mg by mouth daily.     metFORMIN (GLUCOPHAGE) 1000 MG tablet Take 1,000 mg by mouth 2 (two) times daily with a meal.     mupirocin ointment (BACTROBAN) 2 % Apply 1 Application topically 2 (two) times daily. 22 g 0   omeprazole (PRILOSEC) 20 MG capsule      oxyCODONE-acetaminophen (PERCOCET) 5-325 MG tablet Take 1 tablet by mouth every 4 (four) hours as needed for severe pain. 30 tablet 0   oxyCODONE-acetaminophen (PERCOCET) 5-325 MG tablet Take 1 tablet by mouth every 4 (four) hours as needed for severe  pain. 30 tablet 0   oxyCODONE-acetaminophen (PERCOCET) 5-325 MG tablet Take 1 tablet by mouth every 4 (four) hours as needed for severe pain. 30 tablet 0   Semaglutide,0.25 or 0.5MG /DOS, 2 MG/3ML SOPN Inject into the skin.     valsartan (DIOVAN) 160 MG tablet TAKE 1 TABLET BY MOUTH EVERY DAY IN THE MORNING  0   No current facility-administered medications for this visit.    Patient confirms/reports the following allergies:  Allergies  Allergen Reactions   Lisinopril     Other reaction(s): Xerostomia   Gabapentin Anxiety    No orders of the defined types were placed in this encounter.   AUTHORIZATION INFORMATION Primary Insurance: 1D#: Group #:  Secondary Insurance: 1D#: Group #:  SCHEDULE INFORMATION: Date: 02/10/23 Time: Location: [facility]

## 2023-01-21 ENCOUNTER — Telehealth: Payer: Self-pay

## 2023-01-21 NOTE — Telephone Encounter (Signed)
[facility] pt [facility] to make sure [id number] would be approved for Jan procedure.  Pt states office is closed until 01-27-23. I fax letter over for a new referral date. The ref [id number] is good until 01-25-23. Requested fax to [phone]

## 2023-02-05 ENCOUNTER — Telehealth: Payer: Self-pay

## 2023-02-05 NOTE — Telephone Encounter (Signed)
Patient called in to reschedule colonoscopy.

## 2023-02-05 NOTE — Telephone Encounter (Signed)
 Returned phone call. R/S to 03/06/23. Notofied Carol in endo.  Referral updated.  Instructions updated.  Marcelino Duster, CMA

## 2023-03-04 NOTE — Telephone Encounter (Signed)
The patient called in to reschedule his procedure because he has a head cold.

## 2023-03-04 NOTE — Telephone Encounter (Signed)
Phone call returned.  Colonoscopy has been rescheduled from 03/06/23 to 03/27/23 with Dr. Tobi Bastos.  Vikkie in Endo notified.  Instructions updated.  Referral updated.  Thanks, [place], [place]

## 2023-03-26 ENCOUNTER — Encounter: Payer: Self-pay | Admitting: Gastroenterology

## 2023-03-27 ENCOUNTER — Encounter: Payer: Self-pay | Admitting: Gastroenterology

## 2023-03-27 ENCOUNTER — Ambulatory Visit: Payer: No Typology Code available for payment source | Admitting: Anesthesiology

## 2023-03-27 ENCOUNTER — Ambulatory Visit
Admission: RE | Admit: 2023-03-27 | Discharge: 2023-03-27 | Disposition: A | Discharge status: Home / Self Care | Payer: No Typology Code available for payment source | Attending: Gastroenterology | Admitting: Gastroenterology

## 2023-03-27 ENCOUNTER — Encounter
Admission: RE | Disposition: A | Discharge status: Home / Self Care | Payer: Self-pay | Source: Home / Self Care | Attending: Gastroenterology

## 2023-03-27 DIAGNOSIS — K5730 Diverticulosis of large intestine without perforation or abscess without bleeding: Secondary | ICD-10-CM | POA: Diagnosis not present

## 2023-03-27 DIAGNOSIS — G4730 Sleep apnea, unspecified: Secondary | ICD-10-CM | POA: Insufficient documentation

## 2023-03-27 DIAGNOSIS — E119 Type 2 diabetes mellitus without complications: Secondary | ICD-10-CM | POA: Diagnosis not present

## 2023-03-27 DIAGNOSIS — K219 Gastro-esophageal reflux disease without esophagitis: Secondary | ICD-10-CM | POA: Diagnosis not present

## 2023-03-27 DIAGNOSIS — K635 Polyp of colon: Secondary | ICD-10-CM | POA: Diagnosis not present

## 2023-03-27 DIAGNOSIS — M1990 Unspecified osteoarthritis, unspecified site: Secondary | ICD-10-CM | POA: Diagnosis not present

## 2023-03-27 DIAGNOSIS — D126 Benign neoplasm of colon, unspecified: Secondary | ICD-10-CM

## 2023-03-27 DIAGNOSIS — Z79899 Other long term (current) drug therapy: Secondary | ICD-10-CM | POA: Diagnosis not present

## 2023-03-27 DIAGNOSIS — Z1211 Encounter for screening for malignant neoplasm of colon: Secondary | ICD-10-CM

## 2023-03-27 DIAGNOSIS — D122 Benign neoplasm of ascending colon: Secondary | ICD-10-CM | POA: Insufficient documentation

## 2023-03-27 DIAGNOSIS — J449 Chronic obstructive pulmonary disease, unspecified: Secondary | ICD-10-CM | POA: Insufficient documentation

## 2023-03-27 DIAGNOSIS — I10 Essential (primary) hypertension: Secondary | ICD-10-CM | POA: Diagnosis not present

## 2023-03-27 DIAGNOSIS — Z7985 Long-term (current) use of injectable non-insulin antidiabetic drugs: Secondary | ICD-10-CM | POA: Diagnosis not present

## 2023-03-27 DIAGNOSIS — Z7984 Long term (current) use of oral hypoglycemic drugs: Secondary | ICD-10-CM | POA: Insufficient documentation

## 2023-03-27 HISTORY — PX: POLYPECTOMY: SHX5525

## 2023-03-27 HISTORY — DX: Unspecified osteoarthritis, unspecified site: M19.90

## 2023-03-27 HISTORY — PX: COLONOSCOPY WITH PROPOFOL: SHX5780

## 2023-03-27 HISTORY — DX: Gastro-esophageal reflux disease without esophagitis: K21.9

## 2023-03-27 HISTORY — DX: Essential (primary) hypertension: I10

## 2023-03-27 HISTORY — DX: Gout, unspecified: M10.9

## 2023-03-27 SURGERY — COLONOSCOPY WITH PROPOFOL
Anesthesia: General

## 2023-03-27 MED ORDER — PROPOFOL 500 MG/50ML IV EMUL
INTRAVENOUS | Status: DC | PRN
  Administered 2023-03-27: 200 ug/kg/min via INTRAVENOUS

## 2023-03-27 MED ORDER — LIDOCAINE 2% (20 MG/ML) 5 ML SYRINGE
INTRAMUSCULAR | Status: DC | PRN
  Administered 2023-03-27: 50 mg via INTRAVENOUS

## 2023-03-27 MED ORDER — PROPOFOL 10 MG/ML IV BOLUS
INTRAVENOUS | Status: DC | PRN
  Administered 2023-03-27: 100 mg via INTRAVENOUS

## 2023-03-27 MED ORDER — SODIUM CHLORIDE 0.9 % IV SOLN: INTRAVENOUS | Status: DC

## 2023-03-27 MED ORDER — LIDOCAINE HCL (PF) 2 % IJ SOLN
INTRAMUSCULAR | Status: AC
  Filled 2023-03-27: qty 5

## 2023-03-27 NOTE — Transfer of Care (Signed)
 Immediate Anesthesia Transfer of Care Note  Patient: Bill Howell  Procedure(s) Performed: COLONOSCOPY WITH PROPOFOL POLYPECTOMY  Patient Location: Endoscopy Unit  Anesthesia Type:General  Level of Consciousness: drowsy  Airway & Oxygen Therapy: Patient Spontanous Breathing  Post-op Assessment: Report given to RN and Post -op Vital signs reviewed and stable  Post vital signs: Reviewed and stable  Last Vitals:  Vitals Value Taken Time  BP 142/72 03/27/23 0948  Temp 35.9 C 03/27/23 0948  Pulse 84 03/27/23 0951  Resp 23 03/27/23 0951  SpO2 95 % 03/27/23 0951  Vitals shown include unfiled device data.  Last Pain:  Vitals:   03/27/23 0948  TempSrc: Temporal  PainSc: Asleep         Complications: No notable events documented.

## 2023-03-27 NOTE — Op Note (Signed)
 [facility] Gastroenterology Patient Name: Bill Howell Procedure Date: 03/27/2023 9:12 AM MRN: [id number] Account #: [id number] Date of Birth: 01-05-1956 Admit Type: Outpatient Age: 68 Room: [facility] ENDO ROOM 2 Gender: Male Note Status: Finalized Instrument Name: Prentice Docker [id number] Procedure:             Colonoscopy Indications:           Screening for colorectal malignant neoplasm Providers:             Wyline Mood MD, MD Referring MD:          Silas Flood. Ellsworth Lennox, MD (Referring MD) Medicines:             Monitored Anesthesia Care Complications:         No immediate complications. Procedure:             Pre-Anesthesia Assessment:                        - Prior to the procedure, a History and Physical was                         performed, and patient medications, allergies and                         sensitivities were reviewed. The patient's tolerance                         of previous anesthesia was reviewed.                        - The risks and benefits of the procedure and the                         sedation options and risks were discussed with the                         patient. All questions were answered and informed                         consent was obtained.                        - ASA Grade Assessment: II - A patient with mild                         systemic disease.                        After obtaining informed consent, the colonoscope was                         passed under direct vision. Throughout the procedure,                         the patient's blood pressure, pulse, and oxygen                         saturations were monitored continuously. The                         Colonoscope was  introduced through the anus and                         advanced to the the cecum, identified by the                         appendiceal orifice. The colonoscopy was performed                         with ease. The patient tolerated the procedure  well.                         The quality of the bowel preparation was adequate to                         identify polyps. Findings:      The perianal and digital rectal examinations were normal.      Two sessile polyps were found in the ascending colon. The polyps were 5       to 6 mm in size. These polyps were removed with a cold snare. Resection       and retrieval were complete.      A 5 mm polyp was found in the descending colon. The polyp was sessile.       The polyp was removed with a cold snare. Resection and retrieval were       complete.      The exam was otherwise without abnormality on direct and retroflexion       views.      Multiple medium-mouthed diverticula were found in the sigmoid colon. Impression:            - Two 5 to 6 mm polyps in the ascending colon, removed                         with a cold snare. Resected and retrieved.                        - One 5 mm polyp in the descending colon, removed with                         a cold snare. Resected and retrieved.                        - The examination was otherwise normal on direct and                         retroflexion views. Recommendation:        - Discharge patient to home (with escort).                        - Resume previous diet.                        - Continue present medications.                        - Await pathology results.                        - Repeat colonoscopy  in 3 years for surveillance. Procedure Code(s):     --- Professional ---                        [phone], Colonoscopy, flexible; with removal of                         tumor(s), polyp(s), or other lesion(s) by snare                         technique Diagnosis Code(s):     --- Professional ---                        Z12.11, Encounter for screening for malignant neoplasm                         of colon                        D12.2, Benign neoplasm of ascending colon                        D12.4, Benign neoplasm of descending  colon CPT copyright 2022 American Medical Association. All rights reserved. The codes documented in this report are preliminary and upon coder review may  be revised to meet current compliance requirements. Wyline Mood, MD Wyline Mood MD, MD 03/27/2023 9:46:30 AM This report has been signed electronically. Number of Addenda: 0 Note Initiated On: 03/27/2023 9:12 AM Scope Withdrawal Time: 0 hours 10 minutes 42 seconds  Total Procedure Duration: 0 hours 13 minutes 35 seconds  Estimated Blood Loss:  Estimated blood loss: none.      [facility]

## 2023-03-27 NOTE — Anesthesia Preprocedure Evaluation (Signed)
 Anesthesia Evaluation  Patient identified by MRN, date of birth, ID band Patient awake    Reviewed: Allergy & Precautions, NPO status , Patient's Chart, lab work & pertinent test results  Airway Mallampati: II  TM Distance: >3 FB Neck ROM: Full    Dental  (+) Teeth Intact   Pulmonary neg pulmonary ROS, sleep apnea , COPD   Pulmonary exam normal  + decreased breath sounds      Cardiovascular Exercise Tolerance: Good hypertension, Pt. on medications negative cardio ROS Normal cardiovascular exam Rhythm:Regular Rate:Normal     Neuro/Psych negative neurological ROS  negative psych ROS   GI/Hepatic negative GI ROS, Neg liver ROS,GERD  Medicated,,  Endo/Other  negative endocrine ROSdiabetes, Type 2, Oral Hypoglycemic Agents    Renal/GU negative Renal ROS  negative genitourinary   Musculoskeletal  (+) Arthritis ,    Abdominal  (+) + obese  Peds negative pediatric ROS (+)  Hematology negative hematology ROS (+)   Anesthesia Other Findings Past Medical History: No date: Arthritis No date: GERD (gastroesophageal reflux disease) No date: Gout No date: Hypertension  Past Surgical History: No date: ANKLE FRACTURE SURGERY No date: FOOT SURGERY  BMI    Body Mass Index: 35.42 kg/m      Reproductive/Obstetrics negative OB ROS                             Anesthesia Physical Anesthesia Plan  ASA: 3  Anesthesia Plan: General   Post-op Pain Management:    Induction: Intravenous  PONV Risk Score and Plan: Propofol infusion and TIVA  Airway Management Planned: Natural Airway and Nasal Cannula  Additional Equipment:   Intra-op Plan:   Post-operative Plan:   Informed Consent: I have reviewed the patients History and Physical, chart, labs and discussed the procedure including the risks, benefits and alternatives for the proposed anesthesia with the patient or authorized representative  who has indicated his/her understanding and acceptance.     Dental Advisory Given  Plan Discussed with: CRNA  Anesthesia Plan Comments:        Anesthesia Quick Evaluation

## 2023-03-27 NOTE — H&P (Signed)
 Bill Mood, MD [address], [address], [place], [place], [address] [address], [place], [place], [address] Phone: [phone]  Fax: [phone]  Primary Care Physician:  Sherron Monday, MD   Pre-Procedure History & Physical: HPI:  Bill Howell is a 68 y.o. male is here for an colonoscopy.   Past Medical History:  Diagnosis Date   Arthritis    GERD (gastroesophageal reflux disease)    Gout    Hypertension     No past surgical history on file.  Prior to Admission medications   Medication Sig Start Date End Date Taking? Authorizing Provider  allopurinol (ZYLOPRIM) 300 MG tablet Take 1 tablet by mouth daily. 04/10/22   [provider]  amoxicillin-clavulanate (AUGMENTIN) 875-125 MG tablet Take 1 tablet by mouth 2 (two) times daily. 09/05/21   Hyatt, Max T, DPM  atorvastatin (LIPITOR) 20 MG tablet Take by mouth. 04/10/22   [provider]  capsaicin (ZOSTRIX) 0.025 % cream  09/26/14   [provider]  carvedilol (COREG) 25 MG tablet Take 25 mg by mouth 2 (two) times daily with a meal. 04/10/22   [provider]  ibuprofen (ADVIL) 800 MG tablet Take 1 tablet (800 mg total) by mouth every 6 (six) hours as needed. 10/29/21   Candelaria Stagers, DPM  losartan (COZAAR) 100 MG tablet Take 100 mg by mouth daily.    [provider]  metFORMIN (GLUCOPHAGE) 1000 MG tablet Take 1,000 mg by mouth 2 (two) times daily with a meal. 04/10/22   [provider]  mupirocin ointment (BACTROBAN) 2 % Apply 1 Application topically 2 (two) times daily. 09/05/21   Hyatt, Max T, DPM  omeprazole (PRILOSEC) 20 MG capsule  09/26/14   [provider]  oxyCODONE-acetaminophen (PERCOCET) 5-325 MG tablet Take 1 tablet by mouth every 4 (four) hours as needed for severe pain. 10/15/21   Candelaria Stagers, DPM  oxyCODONE-acetaminophen (PERCOCET) 5-325 MG tablet Take 1 tablet by mouth every 4 (four) hours as needed for severe pain. 10/29/21   Candelaria Stagers, DPM  oxyCODONE-acetaminophen (PERCOCET) 5-325 MG tablet Take 1 tablet by mouth every 4 (four) hours as needed for severe pain. 11/06/21   Candelaria Stagers, DPM  Semaglutide,0.25 or 0.5MG /DOS, 2 MG/3ML SOPN Inject into the skin. 09/13/22   [provider]  valsartan (DIOVAN) 160 MG tablet TAKE 1 TABLET BY MOUTH EVERY DAY IN THE MORNING 07/01/16   [provider]    Allergies as of 12/17/2022 - Review Complete 10/16/2022  Allergen Reaction Noted   Lisinopril  06/04/2011   Gabapentin Anxiety 12/12/2020    No family history on file.  Social History   Socioeconomic History   Marital status: Married    Spouse name: Not on file   Number of children: Not on file   Years of education: Not on file   Highest education level: Not on file  Occupational History   Not on file  Tobacco Use   Smoking status: Never   Smokeless tobacco: Never  Substance and Sexual Activity   Alcohol use: No    Alcohol/week: 0.0 standard drinks of alcohol   Drug use: No   Sexual activity: Not on file  Other Topics Concern   Not on file  Social History Narrative   Not on file   Social Drivers of Health   Financial Resource Strain: Not on file  Food Insecurity: Not on file  Transportation Needs: Not on file  Physical  Activity: Not on file  Stress: Not on file  Social Connections: Not on file  Intimate Partner Violence: Not on file    Review of Systems: See HPI, otherwise negative ROS  Physical Exam: There were no vitals taken for this visit. General:   Alert,  pleasant and cooperative in NAD Head:  Normocephalic and atraumatic. Neck:  Supple; no masses or thyromegaly. Lungs:  Clear throughout to auscultation, normal respiratory effort.    Heart:  +S1, +S2, Regular rate and rhythm, No edema. Abdomen:  Soft, nontender and nondistended. Normal bowel sounds, without guarding, and without rebound.   Neurologic:  Alert and  oriented x4;  grossly normal  neurologically.  Impression/Plan: Bill Howell is here for an colonoscopy to be performed for Screening colonoscopy average risk   Risks, benefits, limitations, and alternatives regarding  colonoscopy have been reviewed with the patient.  Questions have been answered.  All parties agreeable.   Bill Mood, MD  03/27/2023, 8:39 AM

## 2023-03-27 NOTE — Anesthesia Postprocedure Evaluation (Signed)
 Anesthesia Post Note  Patient: Bill Howell  Procedure(s) Performed: COLONOSCOPY WITH PROPOFOL POLYPECTOMY  Patient location during evaluation: PACU Anesthesia Type: General Level of consciousness: awake and awake and alert Pain management: satisfactory to patient Vital Signs Assessment: post-procedure vital signs reviewed and stable Respiratory status: spontaneous breathing Cardiovascular status: stable Anesthetic complications: no   No notable events documented.   Last Vitals:  Vitals:   03/27/23 0958 03/27/23 1009  BP: 125/75 130/89  Pulse: 95 75  Resp: (!) 23 12  Temp:    SpO2: 97% 98%    Last Pain:  Vitals:   03/27/23 0958  TempSrc:   PainSc: 0-No pain                 VAN STAVEREN,Johnpaul Gillentine

## 2023-03-28 ENCOUNTER — Encounter: Payer: Self-pay | Admitting: Gastroenterology

## 2023-03-28 LAB — SURGICAL PATHOLOGY

## 2023-03-31 ENCOUNTER — Encounter: Payer: Self-pay | Admitting: Gastroenterology

## 2023-07-10 ENCOUNTER — Other Ambulatory Visit: Payer: Self-pay | Admitting: Internal Medicine

## 2023-07-10 DIAGNOSIS — Z8719 Personal history of other diseases of the digestive system: Secondary | ICD-10-CM

## 2023-07-12 ENCOUNTER — Ambulatory Visit
Admission: RE | Admit: 2023-07-12 | Discharge: 2023-07-12 | Disposition: A | Discharge status: Home / Self Care | Source: Ambulatory Visit | Attending: Internal Medicine | Admitting: Internal Medicine

## 2023-07-12 DIAGNOSIS — Z8719 Personal history of other diseases of the digestive system: Secondary | ICD-10-CM | POA: Diagnosis present

## 2023-07-12 MED ORDER — GADOBUTROL 1 MMOL/ML IV SOLN
10.0000 mL | Freq: Once | INTRAVENOUS | Status: AC | PRN
  Administered 2023-07-12: 10 mL via INTRAVENOUS

## 2023-09-03 ENCOUNTER — Other Ambulatory Visit: Payer: Self-pay | Admitting: Urology

## 2023-09-03 DIAGNOSIS — R972 Elevated prostate specific antigen [PSA]: Secondary | ICD-10-CM

## 2023-09-09 ENCOUNTER — Ambulatory Visit
Admission: RE | Admit: 2023-09-09 | Discharge: 2023-09-09 | Disposition: A | Discharge status: Home / Self Care | Source: Ambulatory Visit | Attending: Urology | Admitting: Urology

## 2023-09-09 DIAGNOSIS — R972 Elevated prostate specific antigen [PSA]: Secondary | ICD-10-CM | POA: Insufficient documentation

## 2023-09-09 MED ORDER — GADOBUTROL 1 MMOL/ML IV SOLN
10.0000 mL | Freq: Once | INTRAVENOUS | Status: AC | PRN
  Administered 2023-09-09 (×2): 10 mL via INTRAVENOUS

## 2023-10-08 ENCOUNTER — Ambulatory Visit (INDEPENDENT_AMBULATORY_CARE_PROVIDER_SITE_OTHER): Admitting: Urology

## 2023-10-08 ENCOUNTER — Encounter: Payer: Self-pay | Admitting: Urology

## 2023-10-08 VITALS — BP 139/77 | HR 79 | Ht 74.0 in | Wt 285.0 lb

## 2023-10-08 DIAGNOSIS — R972 Elevated prostate specific antigen [PSA]: Secondary | ICD-10-CM

## 2023-10-08 MED ORDER — DIAZEPAM 5 MG PO TABS: ORAL_TABLET | ORAL | 0 refills | Status: AC

## 2023-10-08 NOTE — Progress Notes (Signed)
 10/08/2023 4:31 PM   Bill Howell 07-17-1955 [id number]  Referring provider: [facility] [address] [place],  [place] [address]  Chief Complaint  Patient presents with   Elevated PSA    HPI: Bill Howell is a 68 y.o. male referred for evaluation of an elevated PSA.  Followed by urology [facility].  Baseline PSA in the mid 3 range.  A PSA May 2024 was 4.79.  PSA July 2025 was 6.69. Prostate MRI performed [facility] 09/09/2023 remarkable for a 53 cc volume.  A PI-RADS 4 lesion was noted in the left mid gland PZ and a PI-RADS 5 lesion in the right mid gland PZ measuring 1.5 cm. [facility] and he was referred to outside urology for prostate biopsy. 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   PMH: Past Medical History:  Diagnosis Date   Arthritis    GERD (gastroesophageal reflux disease)    Gout    Hypertension     Surgical History: Past Surgical History:  Procedure Laterality Date   ANKLE FRACTURE SURGERY     COLONOSCOPY WITH PROPOFOL  N/A 03/27/2023   Procedure: COLONOSCOPY WITH PROPOFOL ;  Surgeon: Therisa Bi, MD;  Location: [facility] ENDOSCOPY;  Service: Gastroenterology;  Laterality: N/A;   FOOT SURGERY     POLYPECTOMY  03/27/2023   Procedure: POLYPECTOMY;  Surgeon: Therisa Bi, MD;  Location: [facility] ENDOSCOPY;  Service: Gastroenterology;;    Home Medications:  Allergies as of 10/08/2023       Reactions   Lisinopril    Other reaction(s): Xerostomia   Gabapentin Anxiety        Medication List        Accurate as of October 08, 2023  4:31 PM. If you have any questions, ask your nurse or doctor.          STOP taking these medications    amoxicillin -clavulanate 875-125 MG tablet Commonly known as: AUGMENTIN  Stopped by: [name]   capsaicin 0.025 % cream Commonly known as: ZOSTRIX Stopped by: [name]   ibuprofen  800 MG tablet Commonly known as:  ADVIL  Stopped by: [name]   valsartan 160 MG tablet Commonly known as: DIOVAN Stopped by: [name]       TAKE these medications    allopurinol 300 MG tablet Commonly known as: ZYLOPRIM Take 1 tablet by mouth daily.   atorvastatin 20 MG tablet Commonly known as: LIPITOR Take by mouth.   carvedilol 25 MG tablet Commonly known as: COREG Take 25 mg by mouth 2 (two) times daily with a meal.   diazepam  5 MG tablet Commonly known as: VALIUM  1 tab po 30 min prior to procedure Started by: [name]   losartan 100 MG tablet Commonly known as: COZAAR Take 100 mg by mouth daily.   metFORMIN 1000 MG tablet Commonly known as: GLUCOPHAGE Take 1,000 mg by mouth 2 (two) times daily with a meal.   mupirocin  ointment 2 % Commonly known as: BACTROBAN  Apply 1 Application topically 2 (two) times daily.   omeprazole 20 MG capsule Commonly known as: PRILOSEC   oxyCODONE -acetaminophen  5-325 MG tablet Commonly known as: Percocet Take 1 tablet by mouth every 4 (four) hours as needed for severe pain. What changed: Another medication with the same name was removed. Continue taking this medication, and follow the directions you see here. Changed by: [name]   Semaglutide(0.25 or 0.5MG /DOS) 2 MG/3ML Sopn Inject into the skin.  Allergies:  Allergies  Allergen Reactions   Lisinopril     Other reaction(s): Xerostomia   Gabapentin Anxiety    Family History: History reviewed. No pertinent family history.  Social History:  reports that he has never smoked. He has never used smokeless tobacco. He reports that he does not drink alcohol and does not use drugs.   Physical Exam: BP 139/77   Pulse 79   Ht 6' 2 (1.88 m)   Wt 285 lb (129.3 kg)   BMI 36.59 kg/m   Constitutional:  Alert and oriented, No acute distress. HEENT: [place] AT Respiratory: Normal respiratory effort, no increased work of breathing. GU: Prostate deferred until time of  biopsy Psychiatric: Normal mood and affect.     Assessment & Plan:    1.  Elevated PSA Prostate MRI with PI-RADS 4/PI-RADS 5 lesions We discussed that these lesions are suspicious for high-grade prostate cancer and MR fusion biopsy recommended The procedure was discussed including potential risk LUTS complications of bleeding and infection/sepsis All questions were answered and he desires to schedule Rx Valium  10 mg sent to pharmacy and instructed he will need a driver if utilizing this medication    No follow-ups on file.  [name], MD  [facility] [address], [address] [place], [place] [address] [phone]

## 2023-10-08 NOTE — Patient Instructions (Signed)
 Transrectal Prostate Biopsy/Fusion Biopsy Patient Education and Post Procedure Instructions    -Definition A prostate biopsy is the removal of a small amount of tissue from the prostate gland. The tissue is examined to determine whether there is cancer.  -Reasons for Procedure A prostate biopsy is usually done after an abnormal finding by: Digital rectal exam Prostate specific antigen (PSA) blood test A prostate biopsy is the only way to find out if cancer cells are present.  -Possible Complications Problems from the procedure are rare, but all procedures have some risk including: Infection Bruising or lengthy bleeding from the rectum, or in urine or semen Difficulty urinating Reactions to anesthesia Factors that may increase the risk of complications include: Smoking History of bleeding disorders or easy bruising Use of any medications, over-the-counter medications, or herbal supplements Sensitivity or allergy to latex, medications, or anesthesia.  -Prior to Procedure Talk to your doctor about your medications. Blood thinning medications including aspirin should be stopped 1 week prior to procedure. If prescribed by your cardiologist we may need approval before stopping medications. Use a Fleets enema 2 hours before the procedure. Can be purchased at your pharmacy. Antibiotics will be administered in the clinic prior to procedure.  Please make sure you eat a light meal prior to coming in for your appointment. This can help prevent lightheadedness during the procedure and upset stomach from antibiotics. Please bring someone with you to the procedure to drive you home.  -Anesthesia Transrectal biopsy: Local anesthesia--Just the area that is being operated on is numbed using an injectable anesthetic.  -Description of the Procedure Transrectal biopsy--Your doctor will insert a small ultrasound device into the rectum. This device will produce sound waves to create an image of the  prostate. These images will help guide placement of the needle. Your doctor will then insert the needle through the wall of the rectum and into the prostate gland. The procedure should take approximately 15-30 minutes.  -Will It Hurt? You may have discomfort and soreness at the biopsy site. Pain and discomfort after the procedure can be managed with medications.  -Postoperative Care When you return home after the procedure, do the following to help ensure a smooth recovery: Stay hydrated. Drink plenty of fluids for the next few days. Avoid difficult physical activity the day and evening of the procedure. Keep in mind that you may see blood in your urine, stool, or semen for several days. Resume any medications that were stopped when you are advised to do so.  After the sample is taken, it will be sent to a pathologist for examination under a microscope. This doctor will analyze the sample for cancer. You will be scheduled for an appointment to discuss results. If cancer is present, your doctor will work with you to develop a treatment plan.   -Call Your Doctor or Seek Immediate Medical Attention It is important to monitor your recovery. Alert your doctor to any problems. If any of the following occur, call your doctor or go to the emergency room: Fever 100.5 or greater within 1 week post procedure go directly to ER Call the office for: Blood in the urine more than 1 week or in semen for more than 6 weeks post-biopsy Pain that you cannot control with the medications you have been given Pain, burning, urgency, or frequency of urination Cough, shortness of breath, or chest pain- if severe go to ER Heavy rectal bleeding or bleeding that lasts more than 1 week after the biopsy If you have  any questions or concerns please contact our office at [facility]  [facility] [place] [address] [place], [place] [address]  [phone]

## 2023-10-27 ENCOUNTER — Ambulatory Visit: Admitting: Student in an Organized Health Care Education/Training Program

## 2023-10-27 ENCOUNTER — Encounter: Payer: Self-pay | Admitting: Student in an Organized Health Care Education/Training Program

## 2023-10-27 VITALS — BP 120/70 | HR 74 | Temp 97.1°F | Ht 74.0 in | Wt 291.2 lb

## 2023-10-27 DIAGNOSIS — R911 Solitary pulmonary nodule: Secondary | ICD-10-CM | POA: Diagnosis not present

## 2023-10-27 NOTE — Progress Notes (Signed)
 Assessment & Plan:   1. Lung nodule (Primary)  The patient is presenting for the workup of very fine centrilobular nodularity noted on a CT scan of the chest from a year ago. He continues to have minimal symptoms and is a never smoker (unlikely to represent respiratory bronchiolitis). We had hoped to obtain a repeat CT scan of the chest prior to today's visit but were unable to. I will re-order his CT scan. I was also unable to review the flow volume loops, and given report of restriction, I will reorder his PFT's to further evaluate his symptoms (given report of nocturnal wheeze now).   - Pulmonary Function Test; Future - CT CHEST HIGH RESOLUTION; Future   Return in about 3 months (around 01/26/2024).  I spent 30 minutes caring for this patient today, including preparing to see the patient, obtaining a medical history , reviewing a separately obtained history, performing a medically appropriate examination and/or evaluation, counseling and educating the patient/family/caregiver, ordering medications, tests, or procedures, documenting clinical information in the electronic health record, and independently interpreting results (not separately reported/billed) and communicating results to the patient/family/caregiver  [name], MD [facility]  End of visit medications:  No orders of the defined types were placed in this encounter.    Current Outpatient Medications:    allopurinol (ZYLOPRIM) 300 MG tablet, Take 1 tablet by mouth daily., Disp: , Rfl:    atorvastatin (LIPITOR) 20 MG tablet, Take by mouth., Disp: , Rfl:    carvedilol (COREG) 25 MG tablet, Take 25 mg by mouth 2 (two) times daily with a meal., Disp: , Rfl:    diazepam  (VALIUM ) 5 MG tablet, 1 tab po 30 min prior to procedure, Disp: 1 tablet, Rfl: 0   losartan (COZAAR) 100 MG tablet, Take 100 mg by mouth daily., Disp: , Rfl:    metFORMIN (GLUCOPHAGE) 1000 MG tablet, Take 1,000 mg by mouth 2 (two)  times daily with a meal., Disp: , Rfl:    mupirocin  ointment (BACTROBAN ) 2 %, Apply 1 Application topically 2 (two) times daily., Disp: 22 g, Rfl: 0   omeprazole (PRILOSEC) 20 MG capsule, , Disp: , Rfl:    Semaglutide,0.25 or 0.5MG /DOS, 2 MG/3ML SOPN, Inject into the skin., Disp: , Rfl:    Subjective:   PATIENT ID: Bill Howell GENDER: male DOB: 05-18-1955, MRN: [id number]  Chief Complaint  Patient presents with   Lung Mass    Dry cough and wheezing at night. No SOB.     HPI  The patient is a pleasant 68 year old male presenting for follow up.  Initial Visit 10/16/2023:  Patient reports being in his usual state of health and denies any respiratory symptoms.  He has no shortness of breath, no cough, no chest pain, no tightness, no sputum production, no fevers, and no chills.  Patient has been seen at the [facility] clinic where he underwent pulmonary function testing.  PFTs were suggestive of restriction and he underwent a high-resolution chest CT for confirmation.  I am able to review the report from the PFTs but not the flow-volume loops.  This was performed in August 2024 with the report showing no spirometry evidence of obstruction, restriction present with a TLC of less than 80% predicted, and mild gas transfer defect that was borderline normal.  Patient then underwent a chest CT without contrast that showed minimal paraseptal emphysema with mild diffuse bronchial wall thickening and defined centrilobular nodularity throughout the lungs, most concentrated in the lung  apices.  The read reports that this is consistent with smoking-related respiratory bronchiolitis.  Return Visit 10/27/2023:  He presents for follow up but was unable to get the repeat CT and weren't able to send in PFT's from the [facility]. He feels that the respiratory symptoms are stable during the day, but he does wheeze at night. He was tested for sleep apnea at the [facility] and told result was positive. He awaits an evaluation with a sleep  physician end of the month.   Patient reports that he is a never smoker but did have some secondhand smoke exposure in the past.  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  He did not deploy anywhere with an active war zone nor did he get exposed to burn pits.  He did spend time at camp [place].  Following return to civilian life, he worked at [employer] where today he clarified that he was not exposed to welding fumes.   Other medical problems include HTN, HLD, and hepatic steatosis.  Ancillary information including prior medications, full medical/surgical/family/social histories, and PFTs (when available) are listed below and have been reviewed.   Review of Systems  Constitutional:  Negative for chills, fever and weight loss.  Respiratory:  Positive for wheezing (at night). Negative for cough, hemoptysis, sputum production and shortness of breath.   Cardiovascular:  Negative for chest pain and palpitations.     Objective:   Vitals:   10/27/23 0835  BP: 120/70  Pulse: 74  Temp: (!) 97.1 F (36.2 C)  SpO2: 97%  Weight: 291 lb 3.2 oz (132.1 kg)  Height: 6' 2 (1.88 m)   97% on RA  BMI Readings from Last 3 Encounters:  10/27/23 37.39 kg/m  10/08/23 36.59 kg/m  09/09/23 35.42 kg/m   Wt Readings from Last 3 Encounters:  10/27/23 291 lb 3.2 oz (132.1 kg)  10/08/23 285 lb (129.3 kg)  09/09/23 291 lb (132 kg)    Physical Exam Constitutional:      Appearance: Normal appearance. He is obese.  Cardiovascular:     Rate and Rhythm: Normal rate and regular rhythm.     Pulses: Normal pulses.     Heart sounds: Normal heart sounds.  Pulmonary:     Effort: Pulmonary effort is normal. No respiratory distress.     Breath sounds: Normal breath sounds. No wheezing or rales.  Neurological:     General: No focal deficit present.     Mental Status: He is alert and oriented to person, place, and time. Mental status is at baseline.        Ancillary Information    Past Medical History:  Diagnosis Date   Arthritis    GERD (gastroesophageal reflux disease)    Gout    Hypertension      History reviewed. No pertinent family history.   Past Surgical History:  Procedure Laterality Date   ANKLE FRACTURE SURGERY     COLONOSCOPY WITH PROPOFOL  N/A 03/27/2023   Procedure: COLONOSCOPY WITH PROPOFOL ;  Surgeon: Therisa Bi, MD;  Location: [facility] ENDOSCOPY;  Service: Gastroenterology;  Laterality: N/A;   FOOT SURGERY     POLYPECTOMY  03/27/2023   Procedure: POLYPECTOMY;  Surgeon: Therisa Bi, MD;  Location: [facility] ENDOSCOPY;  Service: Gastroenterology;;    Social History   Socioeconomic History   Marital status: Married    Spouse name: Not on file   Number of children: Not on file   Years  of education: Not on file   Highest education level: Not on file  Occupational History   Not on file  Tobacco Use   Smoking status: Never   Smokeless tobacco: Never  Substance and Sexual Activity   Alcohol use: No    Alcohol/week: 0.0 standard drinks of alcohol   Drug use: No   Sexual activity: Not on file  Other Topics Concern   Not on file  Social History Narrative   Not on file   Social Drivers of Health   Financial Resource Strain: Not on file  Food Insecurity: Not on file  Transportation Needs: Not on file  Physical Activity: Not on file  Stress: Not on file  Social Connections: Not on file  Intimate Partner Violence: Not on file     Allergies  Allergen Reactions   Lisinopril     Other reaction(s): Xerostomia   Gabapentin Anxiety     CBC No results found for: WBC, RBC, HGB, HCT, PLT, MCV, MCH, MCHC, RDW, LYMPHSABS, MONOABS, EOSABS, BASOSABS  Pulmonary Functions Testing Results:     No data to display          Outpatient Medications Prior to Visit  Medication Sig Dispense Refill   allopurinol (ZYLOPRIM) 300 MG tablet Take 1 tablet by mouth daily.     atorvastatin  (LIPITOR) 20 MG tablet Take by mouth.     carvedilol (COREG) 25 MG tablet Take 25 mg by mouth 2 (two) times daily with a meal.     diazepam  (VALIUM ) 5 MG tablet 1 tab po 30 min prior to procedure 1 tablet 0   losartan (COZAAR) 100 MG tablet Take 100 mg by mouth daily.     metFORMIN (GLUCOPHAGE) 1000 MG tablet Take 1,000 mg by mouth 2 (two) times daily with a meal.     mupirocin  ointment (BACTROBAN ) 2 % Apply 1 Application topically 2 (two) times daily. 22 g 0   omeprazole (PRILOSEC) 20 MG capsule      Semaglutide,0.25 or 0.5MG /DOS, 2 MG/3ML SOPN Inject into the skin.     oxyCODONE -acetaminophen  (PERCOCET) 5-325 MG tablet Take 1 tablet by mouth every 4 (four) hours as needed for severe pain. 30 tablet 0   No facility-administered medications prior to visit.

## 2023-10-28 ENCOUNTER — Ambulatory Visit
Admission: RE | Admit: 2023-10-28 | Discharge: 2023-10-28 | Disposition: A | Discharge status: Home / Self Care | Source: Ambulatory Visit | Attending: Student in an Organized Health Care Education/Training Program | Admitting: Student in an Organized Health Care Education/Training Program

## 2023-10-28 DIAGNOSIS — R911 Solitary pulmonary nodule: Secondary | ICD-10-CM | POA: Diagnosis present

## 2023-10-28 DIAGNOSIS — I2510 Atherosclerotic heart disease of native coronary artery without angina pectoris: Secondary | ICD-10-CM | POA: Insufficient documentation

## 2023-10-28 DIAGNOSIS — I700 Atherosclerosis of aorta: Secondary | ICD-10-CM | POA: Insufficient documentation

## 2023-10-28 DIAGNOSIS — J439 Emphysema, unspecified: Secondary | ICD-10-CM | POA: Insufficient documentation

## 2023-10-28 DIAGNOSIS — Z8546 Personal history of malignant neoplasm of prostate: Secondary | ICD-10-CM | POA: Insufficient documentation

## 2023-10-29 ENCOUNTER — Other Ambulatory Visit: Admitting: Urology

## 2023-11-19 ENCOUNTER — Other Ambulatory Visit: Admitting: Urology

## 2023-11-20 NOTE — Progress Notes (Deleted)
   11/20/23  Indication:  PSA 6.7 (July 2025)  MRI (09/09/23) - PIRADS 4 at left mid PZ, PIRADS 5 at right mid PZ   - 53g gland   MRI Fusion Prostate Biopsy Procedure:   Informed consent was obtained, and we discussed the risks of bleeding and infection/sepsis. A time out was performed to ensure correct patient identity.  Pre-Procedure: - Last PSA Level: No results found for: PSA - Gentamicin and levaquin given for antibiotic prophylaxis - MRI images were reviewed, targets mapped and uploaded via Dynacad   - Prostate volume by MRI: *** cc  ROI 1: PIRADS 4 at left mid PZ ROI 2: PIRADS 5 at right mid PZ  Procedure: - Periprostatic nerve block performed using 20 cc 1% lidocaine   - Anatomic measurements obtained revealing a *** gm prostate, PSA density *** - No significant hypoechoic lesions or median lobe noted  Next, the MRI images were registered to real-time ultrasound using UroNav fusion software with accurate contour alignment. We noted 2 target lesions. Three biopsy cores were obtained from each target lesion. Next, we obtained an additional 12-core systematic biopsy template.   Total biopsy cores: 12 systematic + 3 cores RO1, 3 cores RO2 = 18 cores  Post-Procedure: - Patient tolerated the procedure well - He was counseled to seek immediate medical attention if experiences significant bleeding, fevers, urinary retention, or severe pain - Return in one week to discuss biopsy results  Assessment/ Plan: Will follow up in ~7-10 days to discuss pathology  Penne [name], MD 11/20/2023

## 2023-11-25 NOTE — Progress Notes (Deleted)
   12/04/2023 3:35 PM   Bill Howell Jun 13, 1955 [id number]  Reason for visit: Follow up prostate bx   HPI: 68 y.o. male, follow up with me today S/p MRI fusion bx (11/24/23) - ***  Prior HPI: PSA 6.7 (July 2025)  MRI (09/09/23) - PIRADS 4 at left mid PZ, PIRADS 5 at right mid PZ   - 53g gland     Physical Exam: There were no vitals taken for this visit.   Constitutional:  Alert and oriented, No acute distress.  Laboratory Data: ***  Pertinent Imaging: N/A    Assessment & Plan:    There are no diagnoses linked to this encounter.     Bill JONELLE [name], MD  [facility] [address], [address] [place], [place] [address] [phone]

## 2023-11-26 ENCOUNTER — Other Ambulatory Visit: Admitting: Urology

## 2023-12-04 ENCOUNTER — Ambulatory Visit: Admitting: Urology

## 2023-12-15 NOTE — Progress Notes (Deleted)
   12/24/2023 2:43 PM   Bill Howell Nov 12, 1955 [id number]  Reason for visit: Follow up prostate bx   HPI: 68 y.o. male, follow up with me today S/p MRI fusion bx (12/18/23, Dr. Francisca) - ***  Prior HPI: PSA 6.7 (July 2025)  MRI (09/09/23) - PIRADS 4 at left mid PZ, PIRADS 5 at right mid PZ   - 53g gland     Physical Exam: There were no vitals taken for this visit.   Constitutional:  Alert and oriented, No acute distress.  Laboratory Data: ***  Pertinent Imaging: N/A    Assessment & Plan:    There are no diagnoses linked to this encounter.     Bill JONELLE [name], MD  [facility] [address] [place], [place] [address] [phone]

## 2023-12-18 ENCOUNTER — Ambulatory Visit (INDEPENDENT_AMBULATORY_CARE_PROVIDER_SITE_OTHER): Admitting: Urology

## 2023-12-18 VITALS — BP 131/78 | HR 86 | Wt 285.0 lb

## 2023-12-18 DIAGNOSIS — C61 Malignant neoplasm of prostate: Secondary | ICD-10-CM | POA: Diagnosis not present

## 2023-12-18 DIAGNOSIS — Z2989 Encounter for other specified prophylactic measures: Secondary | ICD-10-CM

## 2023-12-18 DIAGNOSIS — R972 Elevated prostate specific antigen [PSA]: Secondary | ICD-10-CM

## 2023-12-18 MED ORDER — GENTAMICIN SULFATE 40 MG/ML IJ SOLN
80.0000 mg | Freq: Once | INTRAMUSCULAR | Status: AC
  Administered 2023-12-18: 80 mg via INTRAMUSCULAR

## 2023-12-18 MED ORDER — LEVOFLOXACIN 500 MG PO TABS
500.0000 mg | ORAL_TABLET | Freq: Once | ORAL | Status: AC
  Administered 2023-12-18: 500 mg via ORAL

## 2023-12-18 NOTE — Addendum Note (Signed)
 Addended by: Luke Falero A on: 12/18/2023 02:35 PM   Modules accepted: Orders

## 2023-12-18 NOTE — Patient Instructions (Signed)

## 2023-12-18 NOTE — Progress Notes (Signed)
   12/18/23  Indication: Elevated PSA 6.69, abnormal prostate MRI  MRI Fusion Prostate Biopsy Procedure   Informed consent was obtained, and we discussed the risks of bleeding and infection/sepsis. A time out was performed to ensure correct patient identity.  Pre-Procedure: - Last PSA Level: 6.69 - Gentamicin and levaquin given for antibiotic prophylaxis -Prostate measured 53 g on MRI, PSA density 0.13 - Hypoechoic regions correlated with MRI ROIs  Procedure: - Prostate block performed using 10 cc 1% lidocaine   - MRI fusion biopsy was performed:  ROI #1 PI-RADS 4 lesion (3 biopsies) ROI #2 PI-RADS 5 lesion (4 biopsies)  - Standard biopsies 6 template areas under ultrasound guidance - Total of 16 cores taken  Post-Procedure: - Patient tolerated the procedure well - He was counseled to seek immediate medical attention if experiences significant bleeding, fevers, or severe pain - Return in one week to discuss biopsy results  Assessment/ Plan: Will follow up in 1-2 weeks to discuss pathology with Dr. Twylla Redell Burnet, MD 12/18/2023

## 2023-12-23 ENCOUNTER — Telehealth: Payer: Self-pay

## 2023-12-23 NOTE — Telephone Encounter (Signed)
 Called pt to reschedule his 8:00 appointment for tomorrow because his fusion biopsy results were not in. Rescheduled for Wednesday Dec 3rd at 8:30 for new appointment.-Briony Parveen,CMA

## 2023-12-24 ENCOUNTER — Ambulatory Visit: Admitting: Urology

## 2023-12-24 DIAGNOSIS — C61 Malignant neoplasm of prostate: Secondary | ICD-10-CM | POA: Insufficient documentation

## 2023-12-24 LAB — PROSTATE CORE NEEDLE BIOPSY

## 2023-12-24 NOTE — Assessment & Plan Note (Addendum)
 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  We reviewed his prior workup, lab data, and biopsy results. Using this information, and comparing against thousands of men who have come before him, with similar diagnostic findings, we are able to risk-stratify his disease and offer the safest and most effective treatment option. AUA risk stratifications include very low risk, low risk, intermediate risk, and high risk disease. He meets criteria for favorable intermediate risk. I explained the need for additional staging with unfavorable intermediate risk and high risk groups, including conventional CT scans, bone scans, and newer modalities such as PSMA/PET. I explained that his life expectancy, clinical stage, Gleason score, PSA, and relevant co-morbidities influence our treatment strategies and options. We discussed the roles of active surveillance versus definitive therapies, in the form of radiation or surgery. A treatment should be carefully selected weighing all factors, including patient preference, with a goal to minimize long-term morbidity while preserving cancer-free mortality. For definitive treatments, I reviewed the comparable oncologic outcomes regarding radiation and surgery-- each with their attendant risk profiles and timelines. I emphasized that patients with baseline urinary or lower colonic symptoms may be impacted by treatment strategy, as patients with severe lower urinary tract symptoms may have significant worsening or even develop urinary retention after undergoing radiation.  In regards to surgery, we discussed the robotic radical prostatectomy +/- lymphadenectomy at length. The procedure takes 3 to 4 hours under general anesthesia, with expected discharge home on post-op day #1.  A Foley  catheter is left in place for 7 to 10 days to allow for healing of the vesicourethral anastomosis. There is a small risk of bleeding, infection, damage to surrounding structures or bowel, hernia, DVT/PE, or serious cardiac or pulmonary complications.  We discussed post-op side effects including erectile dysfunction, and the importance of pre-operative erectile function on long-term outcomes.  Even with a nerve sparing approach, there is an approximately 25% rate of permanent erectile dysfunction. We also discussed postop urinary incontinence at length.  We expect patients to have stress incontinence post-operatively that slowly improves over a period of weeks to months. Less than 10% of men will require a pad at 1 year after surgery. Pelvic floor strengthening and rehabilitation is strongly recommended to improve these outcomes. Patients will need to avoid heavy lifting and strenuous activity for 3 to 4 weeks, but most men return to their baseline activity status by 6 weeks.  - He is against surgical options, prefers radiation treatment approach-which I support, he is at higher perioperative risk with comorbidities and BMI - Refer to radiation oncology for definitive treatment (he preferred [facility], although he may also get this through [facility] system) - He may see me at any point for continued management of BPH LUTS, particularly through early stages of radiation treatment

## 2023-12-24 NOTE — Progress Notes (Signed)
 12/31/2023 8:33 AM   Bill Howell [birth year] [id number]  Reason for visit: Follow up prostate biopsy   HPI: 68 y.o. male, follow up with me today  S/p MRI fusion bx (Dr. Francisca, 12/18/23) - GS 3+4 =7 in 4/8cores (up to ~95%), GS6 in 2 /8 cores  First time meeting today Very pleasant gentleman Initially worked up through [facility] in [place]  No family history of prostate cancer History of baseline LUTS on Flomax -IPSS 17/3 today Recently started CPAP this week for OSA-feels much better including nocturia  Prior HPI: Followed by [facility]rology [facility].  Baseline PSA in the mid 3 range.  A PSA May 2024 was 4.79.  PSA July 2025 was 6.69. Prostate MRI performed [facility] 09/09/2023 remarkable for a 53 cc volume.  A PI-RADS 4 lesion was noted in the left mid gland PZ and a PI-RADS 5 lesion in the right mid gland PZ measuring 1.5 cm. [facility] and he was referred to outside urology for prostate biopsy. 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    Physical Exam: There were no vitals taken for this visit.   Constitutional:  Alert and oriented, No acute distress.   Laboratory Data:         Pertinent Imaging: MRI prostate (09/15/23) - IMPRESSION: 53g gland 1. Broad lesion in the posterior LEFT mid gland peripheral zone concerning for high-grade prostate adenocarcinoma. PI-RADS: 4. ROI # 1. 2. Broad lesion in the anterior RIGHT mid gland peripheral zone concerning for high-grade prostate adenocarcinoma. PI-RADS: 5 (5 designation for lesions 1.5 cm). ROI # 2. 3. Enlarged nodular transitional zone most consistent with benign prostate hypertrophy. PI-RADS: 2.    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  We reviewed his prior workup, lab data, and biopsy results. Using this information, and comparing against thousands of men who have come before him, with similar diagnostic findings, we are able to risk-stratify his disease and offer the safest and most effective treatment option. AUA risk stratifications include very low risk, low risk, intermediate risk, and high risk disease. He meets criteria for favorable intermediate risk. I explained the need for additional staging with unfavorable intermediate risk and high risk groups, including conventional CT scans, bone scans, and newer modalities such as PSMA/PET. I explained that his life expectancy, clinical stage, Gleason score, PSA, and relevant co-morbidities influence our treatment strategies and options. We discussed the roles of active surveillance versus definitive therapies, in the form of radiation or surgery. A treatment should be carefully selected weighing all factors, including patient preference, with a goal to minimize long-term morbidity while preserving cancer-free mortality. For definitive treatments, I reviewed the comparable oncologic outcomes regarding radiation and surgery-- each with their attendant risk profiles and timelines. I emphasized that patients with baseline urinary or lower colonic symptoms may be impacted by treatment strategy, as patients with severe lower urinary tract symptoms may have significant worsening or even develop urinary retention after undergoing radiation.  In regards to surgery, we discussed the robotic radical prostatectomy +/- lymphadenectomy at length. The procedure takes 3 to 4 hours under general anesthesia, with expected discharge home on post-op day #  1.  A Foley catheter is left in place for 7 to 10 days to allow for healing of the vesicourethral anastomosis. There is a small risk of bleeding, infection, damage to surrounding structures  or bowel, hernia, DVT/PE, or serious cardiac or pulmonary complications.  We discussed post-op side effects including erectile dysfunction, and the importance of pre-operative erectile function on long-term outcomes.  Even with a nerve sparing approach, there is an approximately 25% rate of permanent erectile dysfunction. We also discussed postop urinary incontinence at length.  We expect patients to have stress incontinence post-operatively that slowly improves over a period of weeks to months. Less than 10% of men will require a pad at 1 year after surgery. Pelvic floor strengthening and rehabilitation is strongly recommended to improve these outcomes. Patients will need to avoid heavy lifting and strenuous activity for 3 to 4 weeks, but most men return to their baseline activity status by 6 weeks.  - He is against surgical options, prefers radiation treatment approach-which I support, he is at higher perioperative risk with comorbidities and BMI - Refer to radiation oncology for definitive treatment (he preferred [facility], although he may also get this through [facility]) - He may see me at any point for continued management of BPH LUTS, particularly through early stages of radiation treatment          Bill JONELLE [name], MD  [facility] [address] [place], [place] [address] [phone]

## 2023-12-31 ENCOUNTER — Ambulatory Visit: Admitting: Urology

## 2023-12-31 VITALS — BP 178/84 | HR 84

## 2023-12-31 DIAGNOSIS — C61 Malignant neoplasm of prostate: Secondary | ICD-10-CM | POA: Diagnosis not present

## 2024-01-15 ENCOUNTER — Encounter: Payer: Self-pay | Admitting: Radiation Oncology

## 2024-01-15 ENCOUNTER — Ambulatory Visit
Admission: RE | Admit: 2024-01-15 | Discharge: 2024-01-15 | Disposition: A | Discharge status: Home / Self Care | Source: Ambulatory Visit | Attending: Radiation Oncology | Admitting: Radiation Oncology

## 2024-01-15 VITALS — BP 141/85 | HR 69 | Temp 98.3°F | Resp 20 | Wt 286.0 lb

## 2024-01-15 DIAGNOSIS — K5900 Constipation, unspecified: Secondary | ICD-10-CM | POA: Diagnosis not present

## 2024-01-15 DIAGNOSIS — Z79899 Other long term (current) drug therapy: Secondary | ICD-10-CM | POA: Insufficient documentation

## 2024-01-15 DIAGNOSIS — M1990 Unspecified osteoarthritis, unspecified site: Secondary | ICD-10-CM | POA: Insufficient documentation

## 2024-01-15 DIAGNOSIS — C61 Malignant neoplasm of prostate: Secondary | ICD-10-CM | POA: Insufficient documentation

## 2024-01-15 DIAGNOSIS — Z7984 Long term (current) use of oral hypoglycemic drugs: Secondary | ICD-10-CM | POA: Insufficient documentation

## 2024-01-15 DIAGNOSIS — I10 Essential (primary) hypertension: Secondary | ICD-10-CM | POA: Insufficient documentation

## 2024-01-15 DIAGNOSIS — M109 Gout, unspecified: Secondary | ICD-10-CM | POA: Insufficient documentation

## 2024-01-15 NOTE — Consult Note (Signed)
 NEW PATIENT EVALUATION  Name: Bill Howell  MRN: [id number]  Date:   01/15/2024     DOB: February 11, 1955   This 68 y.o. 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  REFERRING PHYSICIAN: [facility]  CHIEF COMPLAINT:  Chief Complaint  Patient presents with   Prostate Cancer    DIAGNOSIS: The encounter diagnosis was Prostate cancer (HCC).   PREVIOUS INVESTIGATIONS:  MRI scan of prostate reviewed Clinical notes reviewed Pathology reports reviewed  HPI: Patient is a 68 year old male presented with an elevated PSA of 6.6.  Previously it had been 4.8 back in May 2024 climbed to 6.6 in July 2025.  He underwent prostate MRI showing broad lesion in the posterior left mid gland peripheral zone concerning for high-grade adenocarcinoma.  He also had a broad lesion in the anterior right mid gland peripheral zone.  He underwent [facility]ro[employer] biopsy showing 4 of 8 cores positive for Gleason 7 (3+4) as well as 2 of 8 cores positive for Gleason 6 (3+3).  Patient has developed what he calls a UTI after his biopsy is still having some frequency of urination and nocturia x 3-4.  He recently started CPAP.  He has a history of baseline LUTS and is on Flomax.  His bowel function is tends towards constipation which may be secondary to metformin which he is on by mouth.  He specifically denies bone pain.  He has been consulted by urology and is now referred to radiation oncology for opinion.  PLANNED TREATMENT REGIMEN: Image guided IMRT radiation therapy plus ADT therapy  PAST MEDICAL HISTORY:  has a past medical history of Arthritis, GERD (gastroesophageal reflux disease), Gout, and Hypertension.    PAST SURGICAL HISTORY:  Past Surgical History:  Procedure Laterality Date   ANKLE FRACTURE SURGERY     COLONOSCOPY WITH PROPOFOL  N/A 03/27/2023   Procedure: COLONOSCOPY WITH PROPOFOL ;   Surgeon: Therisa Bi, MD;  Location: [facility] ENDOSCOPY;  Service: Gastroenterology;  Laterality: N/A;   FOOT SURGERY     POLYPECTOMY  03/27/2023   Procedure: POLYPECTOMY;  Surgeon: Therisa Bi, MD;  Location: [facility] ENDOSCOPY;  Service: Gastroenterology;;    FAMILY HISTORY: family history is not on file.  SOCIAL HISTORY:  reports that he has never smoked. He has never used smokeless tobacco. He reports that he does not drink alcohol and does not use drugs.  ALLERGIES: Empagliflozin, Lisinopril, and Gabapentin  MEDICATIONS:  Current Outpatient Medications  Medication Sig Dispense Refill   allopurinol (ZYLOPRIM) 300 MG tablet Take 1 tablet by mouth daily.     atorvastatin (LIPITOR) 20 MG tablet Take by mouth.     carvedilol (COREG) 25 MG tablet Take 25 mg by mouth 2 (two) times daily with a meal.     diazepam  (VALIUM ) 5 MG tablet 1 tab po 30 min prior to procedure 1 tablet 0   losartan (COZAAR) 100 MG tablet Take 100 mg by mouth daily.     metFORMIN (GLUCOPHAGE) 1000 MG tablet Take 1,000 mg by mouth 2 (two) times daily with a meal.     mupirocin  ointment (BACTROBAN ) 2 % Apply 1 Application topically 2 (two) times daily. 22 g 0   omeprazole (PRILOSEC) 20 MG capsule      sildenafil (VIAGRA) 100 MG tablet Take 100 mg by mouth.     No current facility-administered medications for this encounter.    ECOG PERFORMANCE  STATUS:  0 - Asymptomatic  REVIEW OF SYSTEMS: Patient denies any weight loss, fatigue, weakness, fever, chills or night sweats. Patient denies any loss of vision, blurred vision. Patient denies any ringing  of the ears or hearing loss. No irregular heartbeat. Patient denies heart murmur or history of fainting. Patient denies any chest pain or pain radiating to her upper extremities. Patient denies any shortness of breath, difficulty breathing at night, cough or hemoptysis. Patient denies any swelling in the lower legs. Patient denies any nausea vomiting, vomiting of blood, or coffee  ground material in the vomitus. Patient denies any stomach pain. Patient states has had normal bowel movements no significant constipation or diarrhea. Patient denies any dysuria, hematuria or significant nocturia. Patient denies any problems walking, swelling in the joints or loss of balance. Patient denies any skin changes, loss of hair or loss of weight. Patient denies any excessive worrying or anxiety or significant depression. Patient denies any problems with insomnia. Patient denies excessive thirst, polyuria, polydipsia. Patient denies any swollen glands, patient denies easy bruising or easy bleeding. Patient denies any recent infections, allergies or URI. Patient s visual fields have not changed significantly in recent time.   PHYSICAL EXAM: BP (!) 141/85   Pulse 69   Temp 98.3 F (36.8 C) (Tympanic)   Resp 20   Wt 286 lb (129.7 kg)   BMI 36.72 kg/m  Slightly obese male in NAD.  Well-developed well-nourished patient in NAD. HEENT reveals PERLA, EOMI, discs not visualized.  Oral cavity is clear. No oral mucosal lesions are identified. Neck is clear without evidence of cervical or supraclavicular adenopathy. Lungs are clear to A&P. Cardiac examination is essentially unremarkable with regular rate and rhythm without murmur rub or thrill. Abdomen is benign with no organomegaly or masses noted. Motor sensory and DTR levels are equal and symmetric in the upper and lower extremities. Cranial nerves II through XII are grossly intact. Proprioception is intact. No peripheral adenopathy or edema is identified. No motor or sensory levels are noted. Crude visual fields are within normal range.  LABORATORY DATA: Pathology reports reviewed    RADIOLOGY RESULTS: MRI of prostate reviewed compatible with above-stated findings.  Recent chest CT scan performed of September reviewed showing minimal emphysematous and bronchial wall thickening.  Has a mild pulmonary fibrosis pattern.   IMPRESSION: Stage IIb  Gleason 7 (3+4) adenocarcinoma the prostate in 68 year old male with a PSA in the 7 range  PLAN: At this time I have recommended image guided IMRT radiation therapy to his prostate.  Would plan on delivering 80 Gray over 8 weeks using IMRT treatment planning and delivery.  I have asked urology to place fiducial markers for daily image guided treatment as well as a 46-month Eligard depot.  Risks and benefits of treatment including increased lower urinary tract symptoms diarrhea fatigue alteration blood counts skin reaction all were reviewed with the patient.  He comprehends my recommendations well.  We will set him up for simulation once markers are placed.  Patient comprehends my recommendations well.  I would like to take this opportunity to thank you for allowing me to participate in the care of your patient.SABRA Marcey Penton, MD

## 2024-02-05 ENCOUNTER — Ambulatory Visit (INDEPENDENT_AMBULATORY_CARE_PROVIDER_SITE_OTHER)

## 2024-02-05 ENCOUNTER — Telehealth: Payer: Self-pay

## 2024-02-05 ENCOUNTER — Ambulatory Visit (INDEPENDENT_AMBULATORY_CARE_PROVIDER_SITE_OTHER): Admitting: Student in an Organized Health Care Education/Training Program

## 2024-02-05 VITALS — BP 110/74 | HR 70 | Temp 98.4°F | Ht 74.0 in | Wt 285.0 lb

## 2024-02-05 DIAGNOSIS — G4733 Obstructive sleep apnea (adult) (pediatric): Secondary | ICD-10-CM

## 2024-02-05 DIAGNOSIS — K449 Diaphragmatic hernia without obstruction or gangrene: Secondary | ICD-10-CM | POA: Diagnosis not present

## 2024-02-05 DIAGNOSIS — R911 Solitary pulmonary nodule: Secondary | ICD-10-CM

## 2024-02-05 DIAGNOSIS — R1310 Dysphagia, unspecified: Secondary | ICD-10-CM

## 2024-02-05 DIAGNOSIS — J4540 Moderate persistent asthma, uncomplicated: Secondary | ICD-10-CM

## 2024-02-05 DIAGNOSIS — J849 Interstitial pulmonary disease, unspecified: Secondary | ICD-10-CM | POA: Diagnosis not present

## 2024-02-05 LAB — PULMONARY FUNCTION TEST
DL/VA % pred: 75 %
DL/VA: 2.96 ml/min/mmHg/L
DLCO unc % pred: 61 %
DLCO unc: 17.8 ml/min/mmHg
FEF 25-75 Post: 3.77 L/s
FEF 25-75 Pre: 1.48 L/s
FEF2575-%Change-Post: 154 %
FEF2575-%Pred-Post: 131 %
FEF2575-%Pred-Pre: 51 %
FEV1-%Change-Post: 34 %
FEV1-%Pred-Post: 80 %
FEV1-%Pred-Pre: 59 %
FEV1-Post: 3.03 L
FEV1-Pre: 2.26 L
FEV1FVC-%Change-Post: 5 %
FEV1FVC-%Pred-Pre: 94 %
FEV6-%Change-Post: 26 %
FEV6-%Pred-Post: 84 %
FEV6-%Pred-Pre: 66 %
FEV6-Post: 4.11 L
FEV6-Pre: 3.25 L
FEV6FVC-%Change-Post: 0 %
FEV6FVC-%Pred-Post: 104 %
FEV6FVC-%Pred-Pre: 104 %
FVC-%Change-Post: 26 %
FVC-%Pred-Post: 80 %
FVC-%Pred-Pre: 63 %
FVC-Post: 4.14 L
FVC-Pre: 3.27 L
Post FEV1/FVC ratio: 73 %
Post FEV6/FVC ratio: 99 %
Pre FEV1/FVC ratio: 69 %
Pre FEV6/FVC Ratio: 99 %
RV % pred: 89 %
RV: 2.38 L
TLC % pred: 86 %
TLC: 6.74 L

## 2024-02-05 MED ORDER — FLUTICASONE-SALMETEROL 250-50 MCG/ACT IN AEPB: 1.0000 | INHALATION_SPRAY | Freq: Two times a day (BID) | RESPIRATORY_TRACT | 12 refills | Status: AC

## 2024-02-05 NOTE — Patient Instructions (Signed)
 Full PFT completed today ? ?

## 2024-02-05 NOTE — Progress Notes (Unsigned)
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 A time out was performed to ensure correct patient identity.  Pre-Procedure: - Gentamicin  and levaquin  given for antibiotic prophylaxis  Procedure: - Periprostatic nerve block performed using 10 cc 1% lidocaine   - No significant hypoechoic lesions or median lobe noted  Three gold fiducial markers were placed in standard triangulation pattern at: Right medial apex, Right medial base, Left base.   Post-Procedure: - Patient tolerated the procedure well - He was counseled to seek immediate medical attention if experiences significant bleeding, fevers, urinary retention, or severe pain  Eligard  ADT: Additionally, the patient was administered 45 mg of depot Eligard  to begin his ADT coverage. Procedure documented separately via nursing note.   Assessment/ Plan: -Follow up with Radiation oncology for definitive treatment  Penne [name], MD 02/05/2024   "

## 2024-02-05 NOTE — Progress Notes (Signed)
 " Assessment & Plan  #Moderate persistent asthma  He's had his repeat PFT's today (given previous PFT's had a report of restriction). Multiple abnormalities noted, most significant was improvement in spirometry post-bronchodilator challenge indicating reversible airway obstruction and suggestive of asthma. He also noted symptomatic improvement with the albuterol. Will initiate LABA/ICS with Wixela and check an allergen panel for assess for t-helper cell type 2 response.  - Prescribed Wixela inhaler, one puff twice daily. - fluticasone -salmeterol (WIXELA INHUB) 250-50 MCG/ACT AEPB; Inhale 1 puff into the lungs in the morning and at bedtime.  Dispense: 60 each; Refill: 12 - Allergen Panel (27) + IGE  #Interstitial lung disease  Reviewed HRCT that we'd ordered to follow up on his previously noted nodules. Mild fibrosis noted on CT, with read suggesting IPF. He does have a hiatal hernia on the CT. Will obtain a swallowing study to assess for reflux, as well as test for auto-immune disease. Will also add to ILD board for discussion.  - Ordered swallowing study for reflux-related fibrosis. - Monitor lung scarring, consider treatment if progression occurs. - MyoMarker 3 Plus Profile (RDL) - Rheumatoid factor - CK - Anti-CCP Ab, IgG + IgA (RDL) - ANCA Profile - ANA 12 Plus Profile (RDL) - Aldolase - DG ESOPHAGUS W DOUBLE CM (HD); Future - Allergen Panel (27) + IGE  #Hiatal hernia with dysphagia  Hiatal hernia causing esophageal obstruction and vasovagal reactions. Managed with omeprazole, reducing symptoms.  - Ordered swallowing study for esophageal function. - Continue omeprazole 40 mg for GERD.  #Obstructive sleep apnea  Nasal dryness and epistaxis led to mask adjustment. Using full face mask.  - Continue CPAP therapy with full face mask.   Return in about 4 weeks (around 03/04/2024).  [name], MD  Pulmonary Critical Care  I spent 33 minutes caring for this patient  today, including preparing to see the patient, obtaining a medical history , reviewing a separately obtained history, performing a medically appropriate examination and/or evaluation, counseling and educating the patient/family/caregiver, ordering medications, tests, or procedures, documenting clinical information in the electronic health record, and independently interpreting results (not separately reported/billed) and communicating results to the patient/family/caregiver  End of visit medications:  Meds ordered this encounter  Medications   fluticasone -salmeterol (WIXELA INHUB) 250-50 MCG/ACT AEPB    Sig: Inhale 1 puff into the lungs in the morning and at bedtime.    Dispense:  60 each    Refill:  12    Current Medications[1]   Subjective:   PATIENT ID: Bill Howell GENDER: male DOB: November 05, 1955, MRN: [id number]  Chief Complaint  Patient presents with   Follow-up    PFT results. DOE. Wheezing and dry cough at night occasionally.    HPI  Discussed the use of AI scribe software for clinical note transcription with the patient, who gave verbal consent to proceed.  History of Present Illness  Bill Howell is a 69 year old male who presents with respiratory symptoms.  Initial Visit 10/16/2023:   Patient reports being in his usual state of health and denies any respiratory symptoms.  He has no shortness of breath, no cough, no chest pain, no tightness, no sputum production, no fevers, and no chills.  Patient has been seen at the [facility] clinic where he underwent pulmonary function testing.  PFTs were suggestive of restriction and he underwent a high-resolution chest CT for confirmation.  I am able to review the report from the PFTs but not the flow-volume loops.  This was performed in August  2024 with the report showing no spirometry evidence of obstruction, restriction present with a TLC of less than 80% predicted, and mild gas transfer defect that was borderline normal.  Patient then  underwent a chest CT without contrast that showed minimal paraseptal emphysema with mild diffuse bronchial wall thickening and defined centrilobular nodularity throughout the lungs, most concentrated in the lung apices.  The read reports that this is consistent with smoking-related respiratory bronchiolitis.   Return Visit 10/27/2023:   He presents for follow up but was unable to get the repeat CT and weren't able to send in PFT's from the [facility]. He feels that the respiratory symptoms are stable during the day, but he does wheeze at night. He was tested for sleep apnea at the [facility] and told result was positive. He awaits an evaluation with a sleep physician end of the month.  Return Visit 02/05/2024:  He experiences ongoing breathing difficulties and chronic fatigue. Recently, he started using a sleep apnea machine and is adjusting to a new mask, which has shown some improvement. However, he continues to experience shortness of breath, particularly after exertion. He describes a sensation of 'a weight on his chest' that improved with the use of an inhaler, which made him feel 'looser'.  He has never been diagnosed with asthma and has no history of smoking or exposure to welding, although he mentions past exposure to toxic substances. He is preparing to start radiation therapy for prostate cancer in a couple of weeks.  He has a history of eczema, which affects his ears and has caused issues with nasal dryness and epistaxis when using certain masks for his sleep apnea machine. He switched to a full-face mask to alleviate these symptoms.  He takes 40 mg of omeprazole, which has helped with occasional esophageal obstruction symptoms. He describes episodes where food gets stuck in his esophagus, leading to increased salivation and a vasovagal response, but notes improvement with medication.   Patient reports that he is a never smoker but did have some secondhand smoke exposure in the past.  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  He did not deploy anywhere with an active war zone nor did he get exposed to burn pits.  He did spend time at camp [place].  Following return to civilian life, he worked at [employer] where he was not exposed to welding fumes.   Ancillary information including prior medications, full medical/surgical/family/social histories, and PFTs (when available) are listed below and have been reviewed.    Review of Systems  Constitutional:  Negative for chills, fever and weight loss.  Respiratory:  Positive for shortness of breath. Negative for cough, hemoptysis, sputum production and wheezing.   Cardiovascular:  Negative for chest pain.     Objective:   Vitals:   02/05/24 1030  BP: 110/74  Pulse: 70  Temp: 98.4 F (36.9 C)  SpO2: 99%  Weight: 285 lb (129.3 kg)  Height: 6' 2 (1.88 m)   99% on RA  BMI Readings from Last 3 Encounters:  02/05/24 36.59 kg/m  02/05/24 36.59 kg/m  01/15/24 36.72 kg/m   Wt Readings from Last 3 Encounters:  02/05/24 285 lb (129.3 kg)  02/05/24 285 lb (129.3 kg)  01/15/24 286 lb (129.7 kg)    Physical Exam Constitutional:      Appearance: Normal appearance. He is obese.  Cardiovascular:     Rate and Rhythm: Normal rate and regular  rhythm.     Pulses: Normal pulses.     Heart sounds: Normal heart sounds.  Pulmonary:     Effort: Pulmonary effort is normal.     Breath sounds: Normal breath sounds.  Neurological:     General: No focal deficit present.     Mental Status: He is alert and oriented to person, place, and time. Mental status is at baseline.       Ancillary Information    Past Medical History:  Diagnosis Date   Arthritis    GERD (gastroesophageal reflux disease)    Gout    Hypertension      No family history on file.   Past Surgical History:  Procedure Laterality Date   ANKLE FRACTURE SURGERY     COLONOSCOPY WITH PROPOFOL  N/A 03/27/2023   Procedure:  COLONOSCOPY WITH PROPOFOL ;  Surgeon: Therisa Bi, MD;  Location[facility] ENDOSCOPY;  Service: Gastroenterology;  Laterality: N/A;   FOOT SURGERY     POLYPECTOMY  03/27/2023   Procedure: POLYPECTOMY;  Surgeon: Therisa Bi, MD;  Location: [facility] ENDOSCOPY;  Service: Gastroenterology;;    Social History   Socioeconomic History   Marital status: Significant Other    Spouse name: Not on file   Number of children: Not on file   Years of education: Not on file   Highest education level: Not on file  Occupational History   Not on file  Tobacco Use   Smoking status: Never   Smokeless tobacco: Never  Substance and Sexual Activity   Alcohol use: No    Alcohol/week: 0.0 standard drinks of alcohol   Drug use: No   Sexual activity: Not on file  Other Topics Concern   Not on file  Social History Narrative   Not on file   Social Drivers of Health   Tobacco Use: Low Risk (01/15/2024)   Patient History    Smoking Tobacco Use: Never    Smokeless Tobacco Use: Never    Passive Exposure: Not on file  Financial Resource Strain: Not on file  Food Insecurity: Not on file  Transportation Needs: Not on file  Physical Activity: Not on file  Stress: Not on file  Social Connections: Not on file  Intimate Partner Violence: Not on file  Depression (PHQ2-9): Low Risk (01/15/2024)   Depression (PHQ2-9)    PHQ-2 Score: 0  Alcohol Screen: Not on file  Housing: Not on file  Utilities: Not on file  Health Literacy: Not on file     Allergies[2]   CBC No results found for: WBC, RBC, HGB, HCT, PLT, MCV, MCH, MCHC, RDW, LYMPHSABS, MONOABS, EOSABS, BASOSABS  Pulmonary Functions Testing Results:    Latest Ref Rng & Units 02/05/2024    8:32 AM  PFT Results  FVC-Pre L 3.27   FVC-Predicted Pre % 63   FVC-Post L 4.14   FVC-Predicted Post % 80   Pre FEV1/FVC % % 69   Post FEV1/FCV % % 73   FEV1-Pre L 2.26   FEV1-Predicted Pre % 59   FEV1-Post L 3.03   DLCO uncorrected  ml/min/mmHg 17.80   DLCO UNC% % 61   DLVA Predicted % 75   TLC L 6.74   TLC % Predicted % 86   RV % Predicted % 89     Outpatient Medications Prior to Visit  Medication Sig Dispense Refill   allopurinol (ZYLOPRIM) 300 MG tablet Take 1 tablet by mouth daily.     atorvastatin (LIPITOR) 20 MG tablet Take by mouth.  carvedilol (COREG) 25 MG tablet Take 25 mg by mouth 2 (two) times daily with a meal.     diazepam  (VALIUM ) 5 MG tablet 1 tab po 30 min prior to procedure 1 tablet 0   losartan (COZAAR) 100 MG tablet Take 100 mg by mouth daily.     metFORMIN (GLUCOPHAGE) 1000 MG tablet Take 1,000 mg by mouth 2 (two) times daily with a meal.     mupirocin  ointment (BACTROBAN ) 2 % Apply 1 Application topically 2 (two) times daily. 22 g 0   omeprazole (PRILOSEC) 20 MG capsule      sildenafil (VIAGRA) 100 MG tablet Take 100 mg by mouth.     No facility-administered medications prior to visit.      [1]  Current Outpatient Medications:    fluticasone -salmeterol (WIXELA INHUB) 250-50 MCG/ACT AEPB, Inhale 1 puff into the lungs in the morning and at bedtime., Disp: 60 each, Rfl: 12   allopurinol (ZYLOPRIM) 300 MG tablet, Take 1 tablet by mouth daily., Disp: , Rfl:    atorvastatin (LIPITOR) 20 MG tablet, Take by mouth., Disp: , Rfl:    carvedilol (COREG) 25 MG tablet, Take 25 mg by mouth 2 (two) times daily with a meal., Disp: , Rfl:    diazepam  (VALIUM ) 5 MG tablet, 1 tab po 30 min prior to procedure, Disp: 1 tablet, Rfl: 0   losartan (COZAAR) 100 MG tablet, Take 100 mg by mouth daily., Disp: , Rfl:    metFORMIN (GLUCOPHAGE) 1000 MG tablet, Take 1,000 mg by mouth 2 (two) times daily with a meal., Disp: , Rfl:    mupirocin  ointment (BACTROBAN ) 2 %, Apply 1 Application topically 2 (two) times daily., Disp: 22 g, Rfl: 0   omeprazole (PRILOSEC) 20 MG capsule, , Disp: , Rfl:    sildenafil (VIAGRA) 100 MG tablet, Take 100 mg by mouth., Disp: , Rfl:  [2]  Allergies Allergen Reactions   Empagliflozin  Other (See Comments)   Lisinopril     Other reaction(s): Xerostomia   Gabapentin Anxiety   "

## 2024-02-05 NOTE — Progress Notes (Signed)
 Full PFT completed today ? ?

## 2024-02-05 NOTE — Patient Instructions (Addendum)
" °  VISIT SUMMARY: During your visit, we discussed your ongoing respiratory symptoms, including breathing difficulties and chronic fatigue. We reviewed your recent use of a sleep apnea machine and the adjustments made to your mask. We also addressed your shortness of breath, the sensation of chest heaviness, and your history of eczema.  YOUR PLAN: -MODERATE PERSISTENT ASTHMA: Asthma is a condition where your airways become inflamed and narrow, making it hard to breathe. We noticed significant improvement in your lung function after using a bronchodilator, which indicates airway obstruction. You will start using a Wixela inhaler, one puff twice daily. We have also ordered blood tests to check for autoimmune diseases and scheduled a follow-up to see how you respond to the treatment.  -INTERSTITIAL LUNG DISEASE: This condition involves scarring of the lung tissue, which can affect your breathing. Your CT scan showed minimal lung scarring, possibly related to your hiatal hernia or an autoimmune disease. We have ordered a swallowing study to investigate if reflux is causing fibrosis in your lungs. We will monitor the lung scarring and consider treatment if it progresses.  -HIATAL HERNIA WITH DYSPHAGIA: A hiatal hernia occurs when part of your stomach pushes up through your diaphragm, which can cause difficulty swallowing and other symptoms. This has been causing esophageal obstruction and vasovagal reactions. You will continue taking omeprazole 40 mg to manage your GERD symptoms. We have also ordered a swallowing study to assess your esophageal function.  -OBSTRUCTIVE SLEEP APNEA: This is a condition where your breathing repeatedly stops and starts during sleep. You have experienced nasal dryness and nosebleeds, which led to adjusting your mask. You should continue using your CPAP therapy with the full face mask to help manage your symptoms.  INSTRUCTIONS: Please follow up as scheduled to assess your response  to the asthma treatment. Complete the blood tests for autoimmune disease and the swallowing study as ordered. Continue taking omeprazole 40 mg daily and using your CPAP therapy with the full face mask. If you experience any new or worsening symptoms, please contact our office.  Today, I ordered blood work. You can get them draw at your preferred [facility] draw station. The nearest one to [facility] is at nearby [facility] ([address] [place], [place], [place] [address]).         Contains text generated by [employer]bridge.   "

## 2024-02-05 NOTE — Telephone Encounter (Signed)
 Auth Submission: NO AUTH NEEDED Site of care: Urology Payer: Medicare A/B Medication & CPT/J Code(s) submitted: Eligard  Diagnosis Code:  Route of submission (phone, fax, portal):  Phone # Fax # Auth type: Buy/Bill PB Units/visits requested: 45mg  x 2 doses Reference number:  Approval from: 02/05/24 to 02/27/25

## 2024-02-09 ENCOUNTER — Ambulatory Visit: Admitting: Urology

## 2024-02-09 VITALS — BP 110/70 | HR 67 | Ht 76.0 in | Wt 285.0 lb

## 2024-02-09 DIAGNOSIS — C61 Malignant neoplasm of prostate: Secondary | ICD-10-CM

## 2024-02-09 DIAGNOSIS — R972 Elevated prostate specific antigen [PSA]: Secondary | ICD-10-CM

## 2024-02-09 DIAGNOSIS — Z2989 Encounter for other specified prophylactic measures: Secondary | ICD-10-CM

## 2024-02-09 MED ORDER — LEUPROLIDE ACETATE (6 MONTH) 45 MG ~~LOC~~ KIT
45.0000 mg | PACK | Freq: Once | SUBCUTANEOUS | Status: AC
  Administered 2024-02-09: 45 mg via SUBCUTANEOUS

## 2024-02-09 MED ORDER — GENTAMICIN SULFATE 40 MG/ML IJ SOLN
80.0000 mg | Freq: Once | INTRAMUSCULAR | Status: AC
  Administered 2024-02-09: 80 mg via INTRAMUSCULAR

## 2024-02-09 MED ORDER — LEVOFLOXACIN 500 MG PO TABS
500.0000 mg | ORAL_TABLET | Freq: Once | ORAL | Status: AC
  Administered 2024-02-09: 500 mg via ORAL

## 2024-02-09 NOTE — Addendum Note (Signed)
 Addended by: Xzayvion Vaeth E on: 02/09/2024 02:43 PM   Modules accepted: Orders

## 2024-02-12 ENCOUNTER — Ambulatory Visit
Admission: RE | Admit: 2024-02-12 | Discharge: 2024-02-12 | Disposition: A | Discharge status: Home / Self Care | Source: Ambulatory Visit | Attending: Radiation Oncology | Admitting: Radiation Oncology

## 2024-02-12 DIAGNOSIS — Z510 Encounter for antineoplastic radiation therapy: Secondary | ICD-10-CM | POA: Insufficient documentation

## 2024-02-12 DIAGNOSIS — C61 Malignant neoplasm of prostate: Secondary | ICD-10-CM | POA: Insufficient documentation

## 2024-02-17 ENCOUNTER — Ambulatory Visit
Admission: RE | Admit: 2024-02-17 | Discharge: 2024-02-17 | Disposition: A | Discharge status: Home / Self Care | Source: Ambulatory Visit | Attending: Student in an Organized Health Care Education/Training Program | Admitting: Student in an Organized Health Care Education/Training Program

## 2024-02-17 DIAGNOSIS — J849 Interstitial pulmonary disease, unspecified: Secondary | ICD-10-CM | POA: Diagnosis present

## 2024-02-18 LAB — ALLERGEN PANEL (27) + IGE
Alternaria Alternata IgE: <0.1 kU/L
Aspergillus Fumigatus IgE: <0.1 kU/L
Bahia Grass IgE: <0.1 kU/L
Bermuda Grass IgE: <0.1 kU/L
Cat Dander IgE: <0.1 kU/L
Cedar, Mountain IgE: <0.1 kU/L
Cladosporium Herbarum IgE: <0.1 kU/L
Cocklebur IgE: <0.1 kU/L
Cockroach, American IgE: <0.1 kU/L
Common Silver Birch IgE: <0.1 kU/L
D Farinae IgE: <0.1 kU/L
D Pteronyssinus IgE: <0.1 kU/L
Dog Dander IgE: <0.1 kU/L
Elm, American IgE: <0.1 kU/L
Hickory, White IgE: <0.1 kU/L
IgE (Immunoglobulin E), Serum: 68 [IU]/mL (ref 6–495)
Johnson Grass IgE: <0.1 kU/L
Kentucky Bluegrass IgE: <0.1 kU/L
Maple/Box Elder IgE: <0.1 kU/L
Mucor Racemosus IgE: <0.1 kU/L
Oak, White IgE: <0.1 kU/L
Penicillium Chrysogen IgE: <0.1 kU/L
Pigweed, Rough IgE: <0.1 kU/L
Plantain, English IgE: <0.1 kU/L
Ragweed, Short IgE: <0.1 kU/L
Setomelanomma Rostrat: <0.1 kU/L
Timothy Grass IgE: <0.1 kU/L
White Mulberry IgE: <0.1 kU/L

## 2024-02-18 LAB — ANCA PROFILE
Anti-MPO Antibodies: <0.2 U (ref 0.0–0.9)
Anti-PR3 Antibodies: <0.2 U (ref 0.0–0.9)
Atypical pANCA: <1:20 {titer}
C-ANCA: <1:20 {titer}
P-ANCA: <1:20 {titer}

## 2024-02-18 LAB — MYOMARKER 3 PLUS PROFILE (RDL)
Anti-EJ Ab (RDL): NEGATIVE
Anti-Jo-1 Ab (RDL): <20 U
Anti-Ku Ab (RDL): NEGATIVE
Anti-MDA-5 Ab (CADM-140)(RDL): <20 U
Anti-Mi-2 Ab (RDL): NEGATIVE
Anti-NXP-2 (P140) Ab (RDL): <20 U
Anti-OJ Ab (RDL): NEGATIVE
Anti-PL-12 Ab (RDL: NEGATIVE
Anti-PL-7 Ab (RDL): NEGATIVE
Anti-PM/Scl-100 Ab (RDL): <20 U
Anti-SAE1 Ab, IgG (RDL): <20 U
Anti-SRP Ab (RDL): NEGATIVE
Anti-SS-A 52kD Ab, IgG (RDL): <20 U
Anti-TIF-1gamma Ab (RDL): <20 U
Anti-U1 RNP Ab (RDL): <20 U
Anti-U2 RNP Ab (RDL): NEGATIVE
Anti-U3 RNP (Fibrillarin)(RDL): NEGATIVE

## 2024-02-18 LAB — ANTI-RO/NEG ANA: Anti-Ro (SS-A) Ab (RDL): <20 U

## 2024-02-18 LAB — ANTI-CCP AB, IGG + IGA (RDL): Anti-CCP Ab, IgG + IgA (RDL): <20 U

## 2024-02-18 LAB — ANA 12 PLUS PROFILE (RDL): Anti-Nuclear Ab by IFA (RDL): NEGATIVE

## 2024-02-18 LAB — ALDOLASE: Aldolase: 9 U/L (ref 3.3–10.3)

## 2024-02-18 LAB — RHEUMATOID FACTOR: Rheumatoid fact SerPl-aCnc: <10 [IU]/mL

## 2024-02-18 LAB — CK: Total CK: 75 U/L (ref 41–331)

## 2024-02-19 ENCOUNTER — Other Ambulatory Visit: Payer: Self-pay | Admitting: *Deleted

## 2024-02-19 DIAGNOSIS — C61 Malignant neoplasm of prostate: Secondary | ICD-10-CM

## 2024-02-23 ENCOUNTER — Ambulatory Visit

## 2024-02-24 ENCOUNTER — Ambulatory Visit
Admission: RE | Admit: 2024-02-24 | Discharge: 2024-02-24 | Attending: Radiation Oncology | Admitting: Radiation Oncology

## 2024-02-24 ENCOUNTER — Ambulatory Visit

## 2024-02-25 ENCOUNTER — Other Ambulatory Visit: Payer: Self-pay

## 2024-02-25 ENCOUNTER — Ambulatory Visit
Admission: RE | Admit: 2024-02-25 | Discharge: 2024-02-25 | Attending: Radiation Oncology | Admitting: Radiation Oncology

## 2024-02-25 LAB — RAD ONC ARIA SESSION SUMMARY
Course Elapsed Days: 0
Plan Fractions Treated to Date: 1
Plan Prescribed Dose Per Fraction: 2 Gy
Plan Total Fractions Prescribed: 40
Plan Total Prescribed Dose: 80 Gy
Reference Point Dosage Given to Date: 2 Gy
Reference Point Session Dosage Given: 2 Gy
Session Number: 1

## 2024-02-26 ENCOUNTER — Ambulatory Visit
Admission: RE | Admit: 2024-02-26 | Discharge: 2024-02-26 | Disposition: A | Discharge status: Home / Self Care | Source: Ambulatory Visit | Attending: Radiation Oncology | Admitting: Radiation Oncology

## 2024-02-26 ENCOUNTER — Other Ambulatory Visit: Payer: Self-pay

## 2024-02-26 LAB — RAD ONC ARIA SESSION SUMMARY
Course Elapsed Days: 1
Plan Fractions Treated to Date: 2
Plan Prescribed Dose Per Fraction: 2 Gy
Plan Total Fractions Prescribed: 40
Plan Total Prescribed Dose: 80 Gy
Reference Point Dosage Given to Date: 4 Gy
Reference Point Session Dosage Given: 2 Gy
Session Number: 2

## 2024-02-27 ENCOUNTER — Ambulatory Visit
Admission: RE | Admit: 2024-02-27 | Discharge: 2024-02-27 | Disposition: A | Source: Ambulatory Visit | Attending: Radiation Oncology | Admitting: Radiation Oncology

## 2024-02-27 ENCOUNTER — Other Ambulatory Visit: Payer: Self-pay

## 2024-02-27 LAB — RAD ONC ARIA SESSION SUMMARY
Course Elapsed Days: 2
Plan Fractions Treated to Date: 3
Plan Prescribed Dose Per Fraction: 2 Gy
Plan Total Fractions Prescribed: 40
Plan Total Prescribed Dose: 80 Gy
Reference Point Dosage Given to Date: 6 Gy
Reference Point Session Dosage Given: 2 Gy
Session Number: 3

## 2024-03-01 ENCOUNTER — Inpatient Hospital Stay

## 2024-03-01 ENCOUNTER — Ambulatory Visit

## 2024-03-02 ENCOUNTER — Ambulatory Visit
Admission: RE | Admit: 2024-03-02 | Discharge: 2024-03-02 | Disposition: A | Source: Ambulatory Visit | Attending: Radiation Oncology | Admitting: Radiation Oncology

## 2024-03-02 ENCOUNTER — Other Ambulatory Visit: Payer: Self-pay

## 2024-03-02 LAB — RAD ONC ARIA SESSION SUMMARY
Course Elapsed Days: 6
Plan Fractions Treated to Date: 4
Plan Prescribed Dose Per Fraction: 2 Gy
Plan Total Fractions Prescribed: 40
Plan Total Prescribed Dose: 80 Gy
Reference Point Dosage Given to Date: 8 Gy
Reference Point Session Dosage Given: 2 Gy
Session Number: 4

## 2024-03-03 ENCOUNTER — Ambulatory Visit
Admission: RE | Admit: 2024-03-03 | Discharge: 2024-03-03 | Disposition: A | Source: Ambulatory Visit | Attending: Radiation Oncology | Admitting: Radiation Oncology

## 2024-03-03 ENCOUNTER — Other Ambulatory Visit: Payer: Self-pay

## 2024-03-03 LAB — RAD ONC ARIA SESSION SUMMARY
Course Elapsed Days: 7
Plan Fractions Treated to Date: 5
Plan Prescribed Dose Per Fraction: 2 Gy
Plan Total Fractions Prescribed: 40
Plan Total Prescribed Dose: 80 Gy
Reference Point Dosage Given to Date: 10 Gy
Reference Point Session Dosage Given: 2 Gy
Session Number: 5

## 2024-03-04 ENCOUNTER — Other Ambulatory Visit: Payer: Self-pay

## 2024-03-04 ENCOUNTER — Inpatient Hospital Stay

## 2024-03-04 ENCOUNTER — Ambulatory Visit
Admission: RE | Admit: 2024-03-04 | Discharge: 2024-03-04 | Disposition: A | Source: Ambulatory Visit | Attending: Radiation Oncology | Admitting: Radiation Oncology

## 2024-03-04 DIAGNOSIS — C61 Malignant neoplasm of prostate: Secondary | ICD-10-CM

## 2024-03-04 LAB — CBC (CANCER CENTER ONLY)
HCT: 39.4 % (ref 39.0–52.0)
Hemoglobin: 13.2 g/dL (ref 13.0–17.0)
MCH: 28.1 pg (ref 26.0–34.0)
MCHC: 33.5 g/dL (ref 30.0–36.0)
MCV: 83.8 fL (ref 80.0–100.0)
Platelet Count: 167 10*3/uL (ref 150–400)
RBC: 4.7 MIL/uL (ref 4.22–5.81)
RDW: 14 % (ref 11.5–15.5)
WBC Count: 5.8 10*3/uL (ref 4.0–10.5)
nRBC: 0 % (ref 0.0–0.2)

## 2024-03-04 LAB — RAD ONC ARIA SESSION SUMMARY
Course Elapsed Days: 8
Plan Fractions Treated to Date: 6
Plan Prescribed Dose Per Fraction: 2 Gy
Plan Total Fractions Prescribed: 40
Plan Total Prescribed Dose: 80 Gy
Reference Point Dosage Given to Date: 12 Gy
Reference Point Session Dosage Given: 2 Gy
Session Number: 6

## 2024-03-05 ENCOUNTER — Ambulatory Visit: Admission: RE | Admit: 2024-03-05 | Source: Ambulatory Visit

## 2024-03-05 ENCOUNTER — Other Ambulatory Visit: Admission: RE | Admit: 2024-03-05 | Source: Ambulatory Visit

## 2024-03-05 ENCOUNTER — Other Ambulatory Visit: Payer: Self-pay

## 2024-03-05 ENCOUNTER — Ambulatory Visit: Admitting: Student in an Organized Health Care Education/Training Program

## 2024-03-05 ENCOUNTER — Encounter: Payer: Self-pay | Admitting: Student in an Organized Health Care Education/Training Program

## 2024-03-05 VITALS — BP 120/78 | HR 64 | Temp 97.9°F | Ht 76.0 in | Wt 286.8 lb

## 2024-03-05 DIAGNOSIS — J849 Interstitial pulmonary disease, unspecified: Secondary | ICD-10-CM

## 2024-03-05 DIAGNOSIS — K222 Esophageal obstruction: Secondary | ICD-10-CM

## 2024-03-05 DIAGNOSIS — J4540 Moderate persistent asthma, uncomplicated: Secondary | ICD-10-CM

## 2024-03-05 DIAGNOSIS — G4733 Obstructive sleep apnea (adult) (pediatric): Secondary | ICD-10-CM

## 2024-03-05 LAB — COMPREHENSIVE METABOLIC PANEL WITH GFR
ALT: 27 U/L (ref 0–44)
AST: 29 U/L (ref 15–41)
Albumin: 4.1 g/dL (ref 3.5–5.0)
Alkaline Phosphatase: 106 U/L (ref 38–126)
Anion gap: 10 (ref 5–15)
BUN: 15 mg/dL (ref 8–23)
CO2: 23 mmol/L (ref 22–32)
Calcium: 9.6 mg/dL (ref 8.9–10.3)
Chloride: 102 mmol/L (ref 98–111)
Creatinine, Ser: 0.94 mg/dL (ref 0.61–1.24)
GFR, Estimated: >60 mL/min
Glucose, Bld: 218 mg/dL — ABNORMAL HIGH (ref 70–99)
Potassium: 4.5 mmol/L (ref 3.5–5.1)
Sodium: 135 mmol/L (ref 135–145)
Total Bilirubin: 0.8 mg/dL (ref 0.0–1.2)
Total Protein: 8.1 g/dL (ref 6.5–8.1)

## 2024-03-05 LAB — RAD ONC ARIA SESSION SUMMARY
Course Elapsed Days: 9
Plan Fractions Treated to Date: 7
Plan Prescribed Dose Per Fraction: 2 Gy
Plan Total Fractions Prescribed: 40
Plan Total Prescribed Dose: 80 Gy
Reference Point Dosage Given to Date: 14 Gy
Reference Point Session Dosage Given: 2 Gy
Session Number: 7

## 2024-03-05 MED ORDER — NINTEDANIB ESYLATE 150 MG PO CAPS: 150.0000 mg | ORAL_CAPSULE | Freq: Two times a day (BID) | ORAL | Status: AC

## 2024-03-05 NOTE — Progress Notes (Signed)
 "   Assessment & Plan:   #ILD (interstitial lung disease) (HCC) (Primary)  HRCT to follow up nodules showed mild fibrosis in a UIP pattern suggestive of IPF. 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 Auto-immune workup was negative. Given this, we will start him on nintedanib  to slow down the process, and will also attempt to control reflux (see below) given findings on double contrast esophagogram.  - Ambulatory referral to Pharmacotherapy Clinic - Nintedanib  (OFEV ) 150 MG CAPS; Take 1 capsule (150 mg total) by mouth 2 (two) times daily. - Comprehensive metabolic panel with GFR; Future - Hepatic function panel; Future  #Moderate persistent asthma   PFT's showed improvement in spirometry post bronchodilator challenge, suggestive of asthma. We started ICS/LABA with wixela and this has resulted in symptomatic improvement.    -Continue Wixela 1 puff bid  #Esophageal stricture #Hiatal Hernia with dysphagia  Noted to have signs of esophageal stricture on double contrast esophagogram. This is probably contributing to ILD above with possible chronic reflux disease. Will refer to GI for workup  - Ambulatory referral to Gastroenterology  #OSA  Continue CPAP therapy  Return in about 3 months (around 06/02/2024).  [name], MD  Pulmonary Critical Care  I spent 42 minutes caring for this patient today, including preparing to see the patient, obtaining a medical history , reviewing a separately obtained history, performing a medically appropriate examination and/or evaluation, counseling and educating the patient/family/caregiver, ordering medications, tests, or procedures, documenting clinical information in the electronic health record, and independently interpreting results (not separately reported/billed) and communicating results to the patient/family/caregiver  End of visit medications:  Meds ordered  this encounter  Medications   Nintedanib  (OFEV ) 150 MG CAPS    Sig: Take 1 capsule (150 mg total) by mouth 2 (two) times daily.    Current Medications[1]   Subjective:   PATIENT ID: Bill Howell GENDER: male DOB: 03-03-1955, MRN: [id number]  Chief Complaint  Patient presents with   Asthma    No SOB. Little wheezing at night. Cough with clear sputum in the morning.  Wixela- BID, helps with his breathing.     HPI  [name] is a 69 year old male presenting for follow up.  Initial Visit 10/16/2023:   Patient reports being in his usual state of health and denies any respiratory symptoms.  He has no shortness of breath, no cough, no chest pain, no tightness, no sputum production, no fevers, and no chills.  Patient has been seen at the [facility] clinic where he underwent pulmonary function testing.  PFTs were suggestive of restriction and he underwent a high-resolution chest CT for confirmation.  I am able to review the report from the PFTs but not the flow-volume loops.  This was performed in August 2024 with the report showing no spirometry evidence of obstruction, restriction present with a TLC of less than 80% predicted, and mild gas transfer defect that was borderline normal.  Patient then underwent a chest CT without contrast that showed minimal paraseptal emphysema with mild diffuse bronchial wall thickening and defined centrilobular nodularity throughout the lungs, most concentrated in the lung apices.  The read reports that this is consistent with smoking-related respiratory bronchiolitis.   Return Visit 10/27/2023:   He presents for follow up but was unable to get the repeat CT and weren't able to send in PFT's from the [facility]. He feels that the respiratory symptoms are stable during the day, but  he does wheeze at night. He was tested for sleep apnea at the [facility] and told result was positive. He awaits an evaluation with a sleep physician end of the month.   Return Visit 02/05/2024:   He  experiences ongoing breathing difficulties and chronic fatigue. Recently, he started using a sleep apnea machine and is adjusting to a new mask, which has shown some improvement. However, he continues to experience shortness of breath, particularly after exertion. He describes a sensation of 'a weight on his chest' that improved with the use of an inhaler, which made him feel 'looser'.   He has never been diagnosed with asthma and has no history of smoking or exposure to welding, although he mentions past exposure to toxic substances. He is preparing to start radiation therapy for prostate cancer in a couple of weeks.   He has a history of eczema, which affects his ears and has caused issues with nasal dryness and epistaxis when using certain masks for his sleep apnea machine. He switched to a full-face mask to alleviate these symptoms.   He takes 40 mg of omeprazole, which has helped with occasional esophageal obstruction symptoms. He describes episodes where food gets stuck in his esophagus, leading to increased salivation and a vasovagal response, but notes improvement with medication.     Patient reports that he is a never smoker but did have some secondhand smoke exposure in the past.  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  He did not deploy anywhere with an active war zone nor did he get exposed to burn pits.  He did spend time at camp [employer].  Following return to civilian life, he worked at [employer] where he was not exposed to welding fumes.  Return Visit 03/05/2024:  Feels well on follow up today, with improvement in dyspnea after the initiation of ICS/LABA (Wixela). He reports no new respiratory symptoms, with no worsening cough or increased dyspnea. Does report GI symptoms, reporting episodes of dysphagia and food stuck in his throat at times. He also reports significant reflux disease. He had his workup and is here to discuss the  results.  Ancillary information including prior medications, full medical/surgical/family/social histories, and PFTs (when available) are listed below and have been reviewed.    Review of Systems  Constitutional:  Negative for chills and fever.  Respiratory:  Positive for shortness of breath.   Cardiovascular:  Negative for chest pain.     Objective:   Vitals:   03/05/24 0842  BP: 120/78  Pulse: 64  Temp: 97.9 F (36.6 C)  SpO2: 99%  Weight: 286 lb 12.8 oz (130.1 kg)  Height: 6' 4 (1.93 m)   99% on RA BMI Readings from Last 3 Encounters:  03/05/24 34.91 kg/m  02/09/24 34.69 kg/m  02/05/24 36.59 kg/m   Wt Readings from Last 3 Encounters:  03/05/24 286 lb 12.8 oz (130.1 kg)  02/09/24 285 lb (129.3 kg)  02/05/24 285 lb (129.3 kg)    Physical Exam Constitutional:      Appearance: Normal appearance. He is obese.  Cardiovascular:     Rate and Rhythm: Normal rate and regular rhythm.     Pulses: Normal pulses.     Heart sounds: Normal heart sounds.  Pulmonary:     Breath sounds: Rales present.  Neurological:     General: No focal deficit present.     Mental Status: He is alert and oriented to  person, place, and time. Mental status is at baseline.     Ancillary Information    Past Medical History:  Diagnosis Date   Arthritis    GERD (gastroesophageal reflux disease)    Gout    Hypertension      History reviewed. No pertinent family history.   Past Surgical History:  Procedure Laterality Date   ANKLE FRACTURE SURGERY     COLONOSCOPY WITH PROPOFOL  N/A 03/27/2023   Procedure: COLONOSCOPY WITH PROPOFOL ;  Surgeon: Therisa Bi, MD;  Location: [facility] ENDOSCOPY;  Service: Gastroenterology;  Laterality: N/A;   FOOT SURGERY     POLYPECTOMY  03/27/2023   Procedure: POLYPECTOMY;  Surgeon: Therisa Bi, MD;  Location: [facility] ENDOSCOPY;  Service: Gastroenterology;;    Social History   Socioeconomic History   Marital status: Significant Other    Spouse name: Not on  file   Number of children: Not on file   Years of education: Not on file   Highest education level: Not on file  Occupational History   Not on file  Tobacco Use   Smoking status: Never   Smokeless tobacco: Never  Substance and Sexual Activity   Alcohol use: No    Alcohol/week: 0.0 standard drinks of alcohol   Drug use: No   Sexual activity: Not on file  Other Topics Concern   Not on file  Social History Narrative   Not on file   Social Drivers of Health   Tobacco Use: Low Risk (03/05/2024)   Patient History    Smoking Tobacco Use: Never    Smokeless Tobacco Use: Never    Passive Exposure: Not on file  Financial Resource Strain: Not on file  Food Insecurity: Not on file  Transportation Needs: Not on file  Physical Activity: Not on file  Stress: Not on file  Social Connections: Not on file  Intimate Partner Violence: Not on file  Depression (PHQ2-9): Low Risk (01/15/2024)   Depression (PHQ2-9)    PHQ-2 Score: 0  Alcohol Screen: Not on file  Housing: Not on file  Utilities: Not on file  Health Literacy: Not on file     Allergies[2]   CBC    Component Value Date/Time   WBC 5.8 03/04/2024 1450   RBC 4.70 03/04/2024 1450   HGB 13.2 03/04/2024 1450   HCT 39.4 03/04/2024 1450   PLT 167 03/04/2024 1450   MCV 83.8 03/04/2024 1450   MCH 28.1 03/04/2024 1450   MCHC 33.5 03/04/2024 1450   RDW 14.0 03/04/2024 1450    Pulmonary Functions Testing Results:    Latest Ref Rng & Units 02/05/2024    8:32 AM  PFT Results  FVC-Pre L 3.27   FVC-Predicted Pre % 63   FVC-Post L 4.14   FVC-Predicted Post % 80   Pre FEV1/FVC % % 69   Post FEV1/FCV % % 73   FEV1-Pre L 2.26   FEV1-Predicted Pre % 59   FEV1-Post L 3.03   DLCO uncorrected ml/min/mmHg 17.80   DLCO UNC% % 61   DLVA Predicted % 75   TLC L 6.74   TLC % Predicted % 86   RV % Predicted % 89     Outpatient Medications Prior to Visit  Medication Sig Dispense Refill   allopurinol (ZYLOPRIM) 300 MG tablet Take 1  tablet by mouth daily.     atorvastatin (LIPITOR) 20 MG tablet Take by mouth.     carvedilol (COREG) 25 MG tablet Take 25 mg by mouth 2 (two) times daily  with a meal.     diazepam  (VALIUM ) 5 MG tablet 1 tab po 30 min prior to procedure 1 tablet 0   fluticasone -salmeterol (WIXELA INHUB) 250-50 MCG/ACT AEPB Inhale 1 puff into the lungs in the morning and at bedtime. 60 each 12   losartan (COZAAR) 100 MG tablet Take 100 mg by mouth daily.     metFORMIN (GLUCOPHAGE) 1000 MG tablet Take 1,000 mg by mouth 2 (two) times daily with a meal.     mupirocin  ointment (BACTROBAN ) 2 % Apply 1 Application topically 2 (two) times daily. 22 g 0   omeprazole (PRILOSEC) 20 MG capsule      sildenafil (VIAGRA) 100 MG tablet Take 100 mg by mouth.     No facility-administered medications prior to visit.      [1]  Current Outpatient Medications:    allopurinol (ZYLOPRIM) 300 MG tablet, Take 1 tablet by mouth daily., Disp: , Rfl:    atorvastatin (LIPITOR) 20 MG tablet, Take by mouth., Disp: , Rfl:    carvedilol (COREG) 25 MG tablet, Take 25 mg by mouth 2 (two) times daily with a meal., Disp: , Rfl:    diazepam  (VALIUM ) 5 MG tablet, 1 tab po 30 min prior to procedure, Disp: 1 tablet, Rfl: 0   fluticasone -salmeterol (WIXELA INHUB) 250-50 MCG/ACT AEPB, Inhale 1 puff into the lungs in the morning and at bedtime., Disp: 60 each, Rfl: 12   losartan (COZAAR) 100 MG tablet, Take 100 mg by mouth daily., Disp: , Rfl:    metFORMIN (GLUCOPHAGE) 1000 MG tablet, Take 1,000 mg by mouth 2 (two) times daily with a meal., Disp: , Rfl:    mupirocin  ointment (BACTROBAN ) 2 %, Apply 1 Application topically 2 (two) times daily., Disp: 22 g, Rfl: 0   Nintedanib  (OFEV ) 150 MG CAPS, Take 1 capsule (150 mg total) by mouth 2 (two) times daily., Disp: , Rfl:    omeprazole (PRILOSEC) 20 MG capsule, , Disp: , Rfl:    sildenafil (VIAGRA) 100 MG tablet, Take 100 mg by mouth., Disp: , Rfl:  [2]  Allergies Allergen Reactions   Empagliflozin  Other (See Comments)   Lisinopril     Other reaction(s): Xerostomia   Gabapentin Anxiety   "

## 2024-03-08 ENCOUNTER — Ambulatory Visit

## 2024-03-09 ENCOUNTER — Ambulatory Visit

## 2024-03-10 ENCOUNTER — Ambulatory Visit

## 2024-03-11 ENCOUNTER — Ambulatory Visit

## 2024-03-12 ENCOUNTER — Ambulatory Visit

## 2024-03-15 ENCOUNTER — Ambulatory Visit

## 2024-03-15 ENCOUNTER — Inpatient Hospital Stay

## 2024-03-16 ENCOUNTER — Ambulatory Visit

## 2024-03-17 ENCOUNTER — Ambulatory Visit

## 2024-03-18 ENCOUNTER — Ambulatory Visit

## 2024-03-19 ENCOUNTER — Ambulatory Visit

## 2024-03-22 ENCOUNTER — Ambulatory Visit

## 2024-03-23 ENCOUNTER — Ambulatory Visit

## 2024-03-24 ENCOUNTER — Ambulatory Visit

## 2024-03-25 ENCOUNTER — Ambulatory Visit

## 2024-03-26 ENCOUNTER — Ambulatory Visit

## 2024-03-29 ENCOUNTER — Inpatient Hospital Stay

## 2024-03-29 ENCOUNTER — Ambulatory Visit

## 2024-03-30 ENCOUNTER — Ambulatory Visit

## 2024-03-31 ENCOUNTER — Ambulatory Visit

## 2024-04-01 ENCOUNTER — Ambulatory Visit

## 2024-04-02 ENCOUNTER — Ambulatory Visit

## 2024-04-05 ENCOUNTER — Ambulatory Visit

## 2024-04-06 ENCOUNTER — Ambulatory Visit

## 2024-04-07 ENCOUNTER — Ambulatory Visit

## 2024-04-08 ENCOUNTER — Ambulatory Visit

## 2024-04-09 ENCOUNTER — Ambulatory Visit

## 2024-04-12 ENCOUNTER — Inpatient Hospital Stay

## 2024-04-12 ENCOUNTER — Ambulatory Visit

## 2024-04-13 ENCOUNTER — Ambulatory Visit

## 2024-04-14 ENCOUNTER — Ambulatory Visit

## 2024-04-15 ENCOUNTER — Ambulatory Visit

## 2024-04-16 ENCOUNTER — Ambulatory Visit

## 2024-04-19 ENCOUNTER — Ambulatory Visit

## 2024-04-20 ENCOUNTER — Ambulatory Visit

## 2024-04-21 ENCOUNTER — Ambulatory Visit

## 2024-06-02 ENCOUNTER — Ambulatory Visit: Admitting: Student in an Organized Health Care Education/Training Program
